# Patient Record
Sex: Male | Born: 1945 | Race: White | Hispanic: No | Marital: Married | Smoking: Never smoker
Health system: Southern US, Community
[De-identification: ages and names within clinical notes are randomized; demographics above are authoritative.]

## PROBLEM LIST (undated history)

## (undated) DIAGNOSIS — T7840XA Allergy, unspecified, initial encounter: Secondary | ICD-10-CM

## (undated) DIAGNOSIS — K219 Gastro-esophageal reflux disease without esophagitis: Secondary | ICD-10-CM

## (undated) DIAGNOSIS — J339 Nasal polyp, unspecified: Secondary | ICD-10-CM

## (undated) DIAGNOSIS — E785 Hyperlipidemia, unspecified: Secondary | ICD-10-CM

## (undated) DIAGNOSIS — J309 Allergic rhinitis, unspecified: Secondary | ICD-10-CM

## (undated) DIAGNOSIS — E7800 Pure hypercholesterolemia, unspecified: Secondary | ICD-10-CM

## (undated) HISTORY — DX: Allergy, unspecified, initial encounter: T78.40XA

## (undated) HISTORY — PX: NASAL POLYP SURGERY: SHX186

## (undated) HISTORY — DX: Nasal polyp, unspecified: J33.9

## (undated) HISTORY — DX: Hyperlipidemia, unspecified: E78.5

## (undated) HISTORY — DX: Gastro-esophageal reflux disease without esophagitis: K21.9

---
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
## 2003-11-15 HISTORY — PX: NASAL SINUS SURGERY: SHX719

## 2004-02-10 ENCOUNTER — Encounter: Admission: RE | Admit: 2004-02-10 | Discharge: 2004-02-10 | Payer: Self-pay | Admitting: Otolaryngology

## 2004-03-29 ENCOUNTER — Ambulatory Visit (HOSPITAL_COMMUNITY): Admission: RE | Admit: 2004-03-29 | Discharge: 2004-03-29 | Payer: Self-pay | Admitting: Otolaryngology

## 2004-03-29 ENCOUNTER — Encounter (INDEPENDENT_AMBULATORY_CARE_PROVIDER_SITE_OTHER): Payer: Self-pay | Admitting: *Deleted

## 2004-03-29 ENCOUNTER — Ambulatory Visit (HOSPITAL_BASED_OUTPATIENT_CLINIC_OR_DEPARTMENT_OTHER): Admission: RE | Admit: 2004-03-29 | Discharge: 2004-03-29 | Payer: Self-pay | Admitting: Otolaryngology

## 2004-09-22 ENCOUNTER — Ambulatory Visit: Payer: Self-pay | Admitting: Sports Medicine

## 2004-09-22 ENCOUNTER — Ambulatory Visit (HOSPITAL_COMMUNITY): Admission: RE | Admit: 2004-09-22 | Discharge: 2004-09-22 | Payer: Self-pay | Admitting: Sports Medicine

## 2004-09-23 ENCOUNTER — Ambulatory Visit: Payer: Self-pay | Admitting: Sports Medicine

## 2004-10-20 ENCOUNTER — Ambulatory Visit: Payer: Self-pay | Admitting: Sports Medicine

## 2004-11-24 ENCOUNTER — Ambulatory Visit (HOSPITAL_COMMUNITY): Admission: RE | Admit: 2004-11-24 | Discharge: 2004-11-24 | Payer: Self-pay | Admitting: Sports Medicine

## 2004-12-08 ENCOUNTER — Ambulatory Visit: Payer: Self-pay | Admitting: Sports Medicine

## 2005-03-25 ENCOUNTER — Ambulatory Visit: Payer: Self-pay | Admitting: Family Medicine

## 2005-05-18 ENCOUNTER — Ambulatory Visit: Payer: Self-pay | Admitting: Family Medicine

## 2007-01-11 DIAGNOSIS — J309 Allergic rhinitis, unspecified: Secondary | ICD-10-CM

## 2007-01-11 DIAGNOSIS — E785 Hyperlipidemia, unspecified: Secondary | ICD-10-CM

## 2007-01-11 HISTORY — DX: Hyperlipidemia, unspecified: E78.5

## 2007-01-11 HISTORY — DX: Allergic rhinitis, unspecified: J30.9

## 2009-01-31 ENCOUNTER — Emergency Department (HOSPITAL_COMMUNITY): Admission: EM | Admit: 2009-01-31 | Discharge: 2009-02-01 | Payer: Self-pay | Admitting: Emergency Medicine

## 2011-04-01 NOTE — Op Note (Signed)
NAME:  Carl Henry, Carl Henry                        ACCOUNT NO.:  [id number]   MEDICAL RECORD NO.:  [id number]                   PATIENT TYPE:  AMB   LOCATION:  DSC                                  FACILITY:  [facility]   PHYSICIAN:  Kinnie Scales. Annalee Genta, M.D.            DATE OF BIRTH:  29-Mar-1946   DATE OF PROCEDURE:  03/29/2004  DATE OF DISCHARGE:                                 OPERATIVE REPORT   PREOPERATIVE DIAGNOSES:  1. Chronic nasal polyposis.  2. Chronic sinusitis.  3. Status post prior sinus surgery and septoplasty.   POSTOPERATIVE DIAGNOSES:  1. Chronic nasal polyposis.  2. Chronic sinusitis.  3. Status post prior sinus surgery and septoplasty.   INDICATION FOR PROCEDURE:  1. Chronic nasal polyposis.  2. Chronic sinusitis.  3. Status post prior sinus surgery and septoplasty.   SURGICAL PROCEDURES:  1. Bilateral revision endoscopic sinus surgery with InstaTrak guidance,     consisting of total ethmoidectomy, nasofrontal recess exploration, and     sphenoidotomy with removal of diseased sphenoid tissue.  2. Bilateral inferior turbinate intramural cautery.   ANESTHESIA:  General endotracheal.   SURGEON:  Kinnie Scales. Annalee Genta, M.D.   COMPLICATIONS:  None.   ESTIMATED BLOOD LOSS:  Approximately 600 mL.   The patient was transferred from the operating room to the recovery room in  stable condition.   BRIEF HISTORY:  Carl Henry is a 65 year old white male who was referred for  evaluation of chronic nasal obstruction and chronic sinusitis.  The patient  had a history of nasal polyposis and had undergone multiple prior sinus  procedures, including nasal septoplasty and several sinus procedures for  nasal polyposis, last procedure performed approximately 10 years ago.  The  patient had noted a gradual worsening in symptoms of nasal congestion,  airway obstruction, and recurrent sinusitis.  He was seen in the office and  treated with a course of broad-spectrum antibiotic therapy,  oral steroids,  and nasal hygiene.  At the conclusion of his therapy a CT scan was  performed, which showed near-complete opacification of the nasal cavity and  sinuses.  Given that history he was retreated with a prolonged course of  antibiotics and oral steroids.  A follow-up CT scan was performed, and this  showed some improvement in his nasal polyposis but chronic obstruction of  the ethmoid, frontal sinus, and sphenoid sinuses bilaterally.  The patient  had undergone prior sinonasal surgery, had a large posterior nasal septal  perforation, as well as widely patent bilateral maxillary antrostomies.  Given the patient's clinical history and findings, including a failure to  respond to appropriate medical therapy, an InstaTrak CT scan was performed  and the patient was scheduled for bilateral revision endoscopic sinus  surgery and nasal polypectomy and turbinate reduction.  The risks, benefits,  and possible complications of these surgical procedures was discussed in  detail with the patient and his wife, who understood and concurred with  our  plan for surgery, which was scheduled as above.   SURGICAL PROCEDURE:  The patient was brought to the operating room at [facility] day surgical center.  Under general anesthesia the patient's  nasal cavity was injected with 6 mL of 1% lidocaine with 1:100,000 solution  of epinephrine injected in a submucosal fashion along the lateral nasal  wall, within the nasal polyps, and in the inferior turbinates.  The patient  was also injected via sphenopalatine fossa bilaterally.  The patient's nose  was then packed with Afrin-soaked cottonoid pledgets, which were left in  place for approximately 10 minutes to allow for vasoconstriction.  The  patient was prepped and draped in a sterile fashion and the InstaTrak  headgear was applied.  Anatomic and surgical landmarks were identified and  confirmed.  The surgical procedure was then begun on the  patient's left-hand  side.  Using a 0 degree telescope nasal endoscopy was performed.  The  patient was found to have massive nasal polyposis with near-complete  obstruction of the nasal cavity.  The polyps extended from the ethmoid  sinuses, filling the anterior and posterior aspects of the left nasal  cavity.  Using a straight microdebrider, polypoid disease was resected from  the patient's nasal cavity.  Dissection was carried to the level of the  common ethmoid region, where previous endoscopic sinus surgery had been  undertaken.  The patient had a large left maxillary sinus ostium, which was  patent without evidence of active disease or infection.  Total ethmoidectomy  was then performed using a 0 degree telescope.  Dissection was carried along  the floor of the ethmoid sinus, removing bony septations and polypoid  disease.  The posterior aspect of the ethmoid sinus was identified and a 30  degree nasal endoscope was used along the roof of the ethmoid sinus,  dissecting from posterior to anterior.  The InstaTrak computer-assisted  navigation device was used throughout the case for surgical localization.  The nasofrontal recess was identified.  It was completely occluded with  polyp disease, and curved microdebrider and through-cutting forceps were  used to resect polypoid material at the opening of the frontal sinus.  The  dissection was then extended anteriorly and superiorly to create a widely  patent nasofrontal recess.  The natural ostium of the sphenoid sinus was the  identified.  This was completely overgrown with polypoid material, and  polyps were resected along the inferior and lateral aspect of the natural  ostium of the sphenoid sinus as well as some intrasphenoid sinus disease was  gently resected.  Attention was then turned to the patient's right-hand  side.   Right endoscopic sinus surgery was then undertaken.  Again starting with the 0 degree telescope, heavy nasal  polyposis was resected from within the nasal  cavity, allowing access to the common ethmoid region and maxillary  antrostomy.  The previously-created maxillary antrostomy was widely patent,  and there was no evidence of active infection within the maxillary sinus.  Using a 0 degree telescope, ethmoidectomy was undertaken along the orbit  from anterior to posterior.  Posterior ethmoid air cells were identified and  the roof of the ethmoid sinus was identified with the InstaTrak.  Dissection  was then carried from posterior to anterior using a 30 degree telescope and  curved microdebrider tip for resection of polypoid material along the roof  of the ethmoid sinus.  The nasofrontal recess was identified, and again this  was completely occluded with  polyp disease.  Polyps were resected and  dissection was carried along the anterior aspect of the nasofrontal recess  to allow access to the frontal sinus.  The frontal sinus was patent without  evidence of active sinusitis.  The posterior component of the ethmoid sinus  was then inspected.  The natural ostium of the sphenoid sinus was identified  with the InstaTrak device and the ostium was enlarged in an inferior and  medial direction.  Polypoid disease from the lateral component of the  sphenoid ostium was also resected, creating a patent sphenoidotomy.  With  sinus disease removed, the patient's nasal cavity was irrigated and  inspected.  There were several areas of hemorrhage, which were cauterized  with suction cautery.  A 50/50 mix of Bactroban and Kenalog 40 mg were then  instilled within the frontal, ethmoid, and maxillary sinus bilaterally, and  bilateral Kennedy sinus packs were placed in the common ethmoid cavity after  the application of Bactroban ointment.  They were hydrated with saline  solution.   Attention was then turned to the inferior turbinates.  There was significant  polypoid material along the right inferior turbinate,  which was debrided.  Bilateral inferior turbinate intramural cautery was then performed and the  turbinates were outfractured, creating a more patent nasal cavity.  The  patient's nasal cavity and nasopharynx were then irrigated and suctioned, an  orogastric tube was passed, and the stomach contents were aspirated.  The  patient was awakened from his anesthetic.  He was extubated and transferred  from the operating room to the recovery room in stable condition.  There  were no complications.  Blood loss was approximately 600 mL.                                               Kinnie Scales. Annalee Genta, M.D.    DLS/MEDQ  D:  16/08/9603  T:  03/29/2004  Job:  [id number]

## 2012-02-15 LAB — BASIC METABOLIC PANEL
BUN: 12 mg/dL (ref 4–21)
Creatinine: 0.8 mg/dL (ref <=1.3)
Glucose: 94 mg/dL
Potassium: 4.2 mmol/L (ref 3.4–5.3)
Sodium: 142 mmol/L (ref 137–147)

## 2012-02-15 LAB — CBC AND DIFFERENTIAL: WBC: 5.6 10^3/mL

## 2013-04-01 DIAGNOSIS — Z0271 Encounter for disability determination: Secondary | ICD-10-CM

## 2013-04-09 HISTORY — PX: COLONOSCOPY: SHX174

## 2013-04-09 LAB — HM COLONOSCOPY: HM Colonoscopy: NORMAL

## 2013-08-19 ENCOUNTER — Ambulatory Visit (INDEPENDENT_AMBULATORY_CARE_PROVIDER_SITE_OTHER): Payer: Medicare Other | Admitting: Family Medicine

## 2013-08-19 VITALS — BP 118/62 | HR 60 | Temp 98.0°F | Resp 16 | Ht 70.5 in | Wt 213.0 lb

## 2013-08-19 DIAGNOSIS — J209 Acute bronchitis, unspecified: Secondary | ICD-10-CM

## 2013-08-19 DIAGNOSIS — R059 Cough, unspecified: Secondary | ICD-10-CM

## 2013-08-19 DIAGNOSIS — R05 Cough: Secondary | ICD-10-CM

## 2013-08-19 MED ORDER — AZITHROMYCIN 250 MG PO TABS: ORAL_TABLET | ORAL | Status: DC

## 2013-08-19 MED ORDER — BENZONATATE 100 MG PO CAPS: 100.0000 mg | ORAL_CAPSULE | Freq: Three times a day (TID) | ORAL | Status: DC | PRN

## 2013-08-19 NOTE — Patient Instructions (Addendum)
Use the antibiotic as directed and the tessalon as needed for cough.  Let me know if you are not feeling better soon!

## 2013-08-19 NOTE — Progress Notes (Signed)
[facility] [address] [place] [address] [phone]  Date:  08/19/2013   Name:  Carl Henry   DOB:  1946-02-06   MRN:  [id number]  PCP:  Nilda Simmer, MD    Chief Complaint: Cough   History of Present Illness:  Carl Henry is a 67 y.o. very pleasant male patient who presents with the following:  Here today as a new pt. Generally healthy.   States he has had a "cold" for 4 weeks. It started with a ST, then sinus and head congestion. Now he has "a real deep cough."   The cough is non- productive.   He does not tend to get sick. He did have chills, but these have resolved.  No aches or fever.   No GI symptoms.   He has tried generic mucinex OTC.   He tends to cough more when he lies down at night, and also in the morning He has not noted any wheezing.    He did have runny nose and sneezing, but this went away.  He does blow his nose a bit sometimes.    He is generally healthy.    Patient Active Problem List   Diagnosis Date Noted  . HYPERLIPIDEMIA 01/11/2007  . RHINITIS, ALLERGIC 01/11/2007    No past medical history on file.  No past surgical history on file.  History  Substance Use Topics  . Smoking status: Never Smoker   . Smokeless tobacco: Not on file  . Alcohol Use: Not on file    No family history on file.  Allergies  Allergen Reactions  . Latex     Medication list has been reviewed and updated.  No current outpatient prescriptions on file prior to visit.   No current facility-administered medications on file prior to visit.    Review of Systems:  As per HPI- otherwise negative.   Physical Examination: Filed Vitals:   08/19/13 0943  BP: 118/62  Pulse: 60  Temp: 98 F (36.7 C)  Resp: 16   Filed Vitals:   08/19/13 0943  Height: 5' 10.5" (1.791 m)  Weight: 213 lb (96.616 kg)   Body mass index is 30.12 kg/(m^2). Ideal Body Weight: Weight in (lb) to have BMI = 25: 176.4  GEN: WDWN, NAD,  Non-toxic, A & O x 3, looks well HEENT: Atraumatic, Normocephalic. Neck supple. No masses, No LAD.  Bilateral TM wnl, oropharynx normal.  PEERL,EOMI.   Ears and Nose: No external deformity. CV: RRR, No M/G/R. No JVD. No thrill. No extra heart sounds. PULM: CTA B, no wheezes, crackles, rhonchi. No retractions. No resp. distress. No accessory muscle use. EXTR: No c/c/e NEURO Normal gait.  PSYCH: Normally interactive. Conversant. Not depressed or anxious appearing.  Calm demeanor.    Assessment and Plan: Acute bronchitis - Plan: azithromycin (ZITHROMAX) 250 MG tablet, benzonatate (TESSALON) 100 MG capsule  Cough  Cough for one month.  zpack and tessalon- follow-up if not better.  Defer flu shot today due to acute illness- he will plan to get this in about a week.    Signed Abbe Amsterdam, MD

## 2013-08-29 ENCOUNTER — Ambulatory Visit (INDEPENDENT_AMBULATORY_CARE_PROVIDER_SITE_OTHER): Payer: BC Managed Care – PPO | Admitting: Family Medicine

## 2013-08-29 VITALS — BP 130/70 | HR 61 | Temp 97.9°F | Resp 16 | Ht 70.5 in | Wt 213.0 lb

## 2013-08-29 DIAGNOSIS — Z23 Encounter for immunization: Secondary | ICD-10-CM

## 2013-08-29 DIAGNOSIS — J209 Acute bronchitis, unspecified: Secondary | ICD-10-CM

## 2013-08-29 DIAGNOSIS — J309 Allergic rhinitis, unspecified: Secondary | ICD-10-CM

## 2013-08-29 MED ORDER — MOMETASONE FUROATE 50 MCG/ACT NA SUSP: 2.0000 | Freq: Every day | NASAL | Status: DC

## 2013-08-29 NOTE — Progress Notes (Signed)
Subjective:  This chart was scribed for Carl Chick, MD by Quintella Reichert, ED scribe.  This patient was seen in room [facility] Room 10 and the patient's care was started at 5:06 PM.   [name], male    DOB: 1946-06-05, 67 y.o.   MRN: [id number]  Chief Complaint  Patient presents with  . Follow-up    bronchitis  . Medication Refill    Wants to be put back on Nasonex    HPI   HPI Comments: Carl Henry is a 67 y.o. male who presents for a recheck for bronchitis.  Pt was seen by Dr. Patsy Lager 10 days ago for a persistent cough and diagnosed with bronchitis.  He was given a Z-pack which he took as instructed and he states that his symptoms have since mostly resolved.  He states "I coughed yesterday I think once, and today I don't remember coughing."  Congestion has cleared up mostly but his wife states that he still sounds slightly nasally.  He denies SOB or sinus tenderness.  He is taking Mucinex which is providing significant relief.  He is also requesting to be put back on Nasonex which has been effective for him on prior occasions.    Pt's symptoms initially presented as a sore throat 6 weeks ago.  He then developed rhinorrhea and sinus and head congestion, followed by a cough intermittently productive of mucus from drainage.  He denies producing mucus from his chest.  He states "it began in head, then went down to my throat, and then to my chest."   He sometimes also experienced chills but denies fever.   Pt is generally healthy and notes that before he developed bronchitis he had not had a cold in at least 7 years.  He states that he does have a h/o 2 surgeries to treat severe sinusitis.  He has never been diagnosed with seasonal allergies.    Pt also requests his flu shot today.  He has not had his pneumonia shot.  Pt's last physical was in April 2013 and he states he hopes to wait until April of this year for his next physical because he is planning to travel  with his father to see relatives.  He states his father's health is improved although he expresses some frustration that his father has not been compliant with his medical treatment.   History reviewed. No pertinent past medical history.     Medication List       This list is accurate as of: 08/29/13  5:06 PM.  Always use your most recent med list.               aspirin 81 MG tablet  Take 81 mg by mouth daily.     benzonatate 100 MG capsule  Commonly known as:  TESSALON  Take 1 capsule (100 mg total) by mouth 3 (three) times daily as needed for cough.     dextromethorphan-guaiFENesin 30-600 MG per 12 hr tablet  Commonly known as:  MUCINEX DM  Take 1 tablet by mouth every 12 (twelve) hours.     montelukast 10 MG tablet  Commonly known as:  SINGULAIR  Take 10 mg by mouth at bedtime.     rosuvastatin 10 MG tablet  Commonly known as:  CRESTOR  Take 10 mg by mouth daily.        History reviewed. No pertinent past surgical history.   Allergies  Allergen Reactions  . Latex  Review of Systems  Constitutional: Negative for fever.  HENT: Positive for congestion. Negative for sinus pressure.   Respiratory: Positive for cough (mild, resolving). Negative for shortness of breath.       BP 130/70  Pulse 61  Temp(Src) 97.9 F (36.6 C) (Oral)  Resp 16  Ht 5' 10.5" (1.791 m)  Wt 213 lb (96.616 kg)  BMI 30.12 kg/m2  SpO2 96%  Objective:   Physical Exam  Nursing note and vitals reviewed. Constitutional: [name]. No distress.  HENT:  Nose: Mucosal edema present.  Mouth/Throat: Oropharynx is clear and moist. No oropharyngeal exudate.  Nose with congestion and edema  Eyes: Conjunctivae and EOM are normal. Pupils are equal, round, and reactive to light.  Neck: Neck supple. No thyromegaly present.  Cardiovascular: Normal rate, regular rhythm and normal heart sounds.   No murmur heard. Pulmonary/Chest: Effort normal and breath  sounds normal. No respiratory distress. He has no wheezes. He has no rales.  Lymphadenopathy:    He has no cervical adenopathy.  Neurological: He is alert.  Skin: Skin is warm and dry.  Psychiatric: He has a normal mood and affect. His behavior is normal.   PNEUMOVAX ADMINISTERED IN OFFICE.  INFLUENZA VACCINE ADMINISTERED IN OFFICE.    Assessment & Plan:   1. Allergic rhinitis, cause unspecified   2. Acute bronchitis   3. Need for prophylactic vaccination and inoculation against influenza   4. Need for pneumococcal vaccination    1. Allergic Rhinitis:  Uncontrolled; rx for Nasonex provided. 2.  Acute bronchitis: improved with Zpack.  No further treatment indicated. 3.  S/p Pneumovax. 4. s /p influenza vaccine.  Meds ordered this encounter  Medications  . mometasone (NASONEX) 50 MCG/ACT nasal spray    Sig: Place 2 sprays into the nose daily.    Dispense:  17 g    Refill:  12   I personally performed the services described in this documentation, which was scribed in my presence.  The recorded information has been reviewed and is accurate.  Nilda Simmer, M.D.  [facility] [address] [place], [place]  [address] [phone] phone [phone] fax

## 2013-09-02 ENCOUNTER — Telehealth: Payer: Self-pay

## 2013-09-02 MED ORDER — FLUTICASONE PROPIONATE 50 MCG/ACT NA SUSP: 2.0000 | Freq: Every day | NASAL | Status: DC

## 2013-09-02 NOTE — Telephone Encounter (Signed)
Pharm sent request to change Rx for nasonex to a cheaper alternative. Price for nasonex is #105.00 co-pay.

## 2013-09-02 NOTE — Telephone Encounter (Signed)
Flonase sent to pharmacy

## 2013-10-11 ENCOUNTER — Ambulatory Visit (INDEPENDENT_AMBULATORY_CARE_PROVIDER_SITE_OTHER): Payer: BC Managed Care – PPO | Admitting: Emergency Medicine

## 2013-10-11 VITALS — BP 134/68 | HR 64 | Temp 97.7°F | Resp 14 | Ht 71.0 in | Wt 208.0 lb

## 2013-10-11 DIAGNOSIS — M5430 Sciatica, unspecified side: Secondary | ICD-10-CM

## 2013-10-11 DIAGNOSIS — M5431 Sciatica, right side: Secondary | ICD-10-CM

## 2013-10-11 MED ORDER — HYDROCODONE-ACETAMINOPHEN 5-325 MG PO TABS: 1.0000 | ORAL_TABLET | ORAL | Status: DC | PRN

## 2013-10-11 MED ORDER — CYCLOBENZAPRINE HCL 10 MG PO TABS: 10.0000 mg | ORAL_TABLET | Freq: Three times a day (TID) | ORAL | Status: DC | PRN

## 2013-10-11 MED ORDER — NAPROXEN SODIUM 550 MG PO TABS: 550.0000 mg | ORAL_TABLET | Freq: Two times a day (BID) | ORAL | Status: DC

## 2013-10-11 NOTE — Patient Instructions (Signed)
Sciatica °Sciatica is pain, weakness, numbness, or tingling along the path of the sciatic nerve. The nerve starts in the lower back and runs down the back of each leg. The nerve controls the muscles in the lower leg and in the back of the knee, while also providing sensation to the back of the thigh, lower leg, and the sole of your foot. Sciatica is a symptom of another medical condition. For instance, nerve damage or certain conditions, such as a herniated disk or bone spur on the spine, pinch or put pressure on the sciatic nerve. This causes the pain, weakness, or other sensations normally associated with sciatica. Generally, sciatica only affects one side of the body. °CAUSES  °· Herniated or slipped disc. °· Degenerative disk disease. °· A pain disorder involving the narrow muscle in the buttocks (piriformis syndrome). °· Pelvic injury or fracture. °· Pregnancy. °· Tumor (rare). °SYMPTOMS  °Symptoms can vary from mild to very severe. The symptoms usually travel from the low back to the buttocks and down the back of the leg. Symptoms can include: °· Mild tingling or dull aches in the lower back, leg, or hip. °· Numbness in the back of the calf or sole of the foot. °· Burning sensations in the lower back, leg, or hip. °· Sharp pains in the lower back, leg, or hip. °· Leg weakness. °· Severe back pain inhibiting movement. °These symptoms may get worse with coughing, sneezing, laughing, or prolonged sitting or standing. Also, being overweight may worsen symptoms. °DIAGNOSIS  °Your caregiver will perform a physical exam to look for common symptoms of sciatica. He or she may ask you to do certain movements or activities that would trigger sciatic nerve pain. Other tests may be performed to find the cause of the sciatica. These may include: °· Blood tests. °· X-rays. °· Imaging tests, such as an MRI or CT scan. °TREATMENT  °Treatment is directed at the cause of the sciatic pain. Sometimes, treatment is not necessary  and the pain and discomfort goes away on its own. If treatment is needed, your caregiver may suggest: °· Over-the-counter medicines to relieve pain. °· Prescription medicines, such as anti-inflammatory medicine, muscle relaxants, or narcotics. °· Applying heat or ice to the painful area. °· Steroid injections to lessen pain, irritation, and inflammation around the nerve. °· Reducing activity during periods of pain. °· Exercising and stretching to strengthen your abdomen and improve flexibility of your spine. Your caregiver may suggest losing weight if the extra weight makes the back pain worse. °· Physical therapy. °· Surgery to eliminate what is pressing or pinching the nerve, such as a bone spur or part of a herniated disk. °HOME CARE INSTRUCTIONS  °· Only take over-the-counter or prescription medicines for pain or discomfort as directed by your caregiver. °· Apply ice to the affected area for 20 minutes, 3 4 times a day for the first 48 72 hours. Then try heat in the same way. °· Exercise, stretch, or perform your usual activities if these do not aggravate your pain. °· Attend physical therapy sessions as directed by your caregiver. °· Keep all follow-up appointments as directed by your caregiver. °· Do not wear high heels or shoes that do not provide proper support. °· Check your mattress to see if it is too soft. A firm mattress may lessen your pain and discomfort. °SEEK IMMEDIATE MEDICAL CARE IF:  °· You lose control of your bowel or bladder (incontinence). °· You have increasing weakness in the lower back,   pelvis, buttocks, or legs. °· You have redness or swelling of your back. °· You have a burning sensation when you urinate. °· You have pain that gets worse when you lie down or awakens you at night. °· Your pain is worse than you have experienced in the past. °· Your pain is lasting longer than 4 weeks. °· You are suddenly losing weight without reason. °MAKE SURE YOU: °· Understand these  instructions. °· Will watch your condition. °· Will get help right away if you are not doing well or get worse. °Document Released: 10/25/2001 Document Revised: 05/01/2012 Document Reviewed: 03/11/2012 °Exit[employer]® Patient Information ©2014 [employer]. ° °

## 2013-10-11 NOTE — Progress Notes (Signed)
[facility] [address], [place] [address] [phone]  Date:  10/11/2013   Name:  Carl Henry   DOB:  [birth year]   MRN:  [id number]  PCP:  Nilda Simmer, MD    Chief Complaint: Back Pain   History of Present Illness:  Carl Henry is a 67 y.o. very pleasant male patient who presents with the following:  1 month history of low back pain mostly on right.  Radiated into right leg and into foot.  Now has pain mostly in right foot radiating into lower leg.  No history of injury or overuse.  No weakness, numbness or tingling in leg. Worse with sitting.  No history of prior injury.  No improvement with over the counter medications or other home remedies. Denies other complaint or health concern today.   Patient Active Problem List   Diagnosis Date Noted  . HYPERLIPIDEMIA 01/11/2007  . RHINITIS, ALLERGIC 01/11/2007    No past medical history on file.  No past surgical history on file.  History  Substance Use Topics  . Smoking status: Never Smoker   . Smokeless tobacco: Not on file  . Alcohol Use: Not on file    No family history on file.  Allergies  Allergen Reactions  . Latex     Medication list has been reviewed and updated.  Current Outpatient Prescriptions on File Prior to Visit  Medication Sig Dispense Refill  . aspirin 81 MG tablet Take 81 mg by mouth daily.      . fluticasone (FLONASE) 50 MCG/ACT nasal spray Place 2 sprays into the nose daily.  16 g  12  . montelukast (SINGULAIR) 10 MG tablet Take 10 mg by mouth at bedtime.      . rosuvastatin (CRESTOR) 10 MG tablet Take 10 mg by mouth daily.       No current facility-administered medications on file prior to visit.    Review of Systems:  As per HPI, otherwise negative.    Physical Examination: Filed Vitals:   10/11/13 0857  BP: 134/68  Pulse: 64  Temp: 97.7 F (36.5 C)  Resp: 14   Filed Vitals:   10/11/13 0857  Height: 5\' 11"  (1.803 m)  Weight: 208 lb (94.348  kg)   Body mass index is 29.02 kg/(m^2). Ideal Body Weight: Weight in (lb) to have BMI = 25: 178.9   GEN: WDWN, NAD, Non-toxic, Alert & Oriented x 3 HEENT: Atraumatic, Normocephalic.  Ears and Nose: No external deformity. EXTR: No clubbing/cyanosis/edema NEURO: Normal gait. Gross motor intact.   PSYCH: Normally interactive. Conversant. Not depressed or anxious appearing.  Calm demeanor.  Back:  Tender right sciatic notch with normal neuro exam.     Assessment and Plan: Sciatic neuritis Anaprox ds Flexeril vicodin Follow up in 2 weeks if not improved or sooner if worse.  Plan MRI  Signed,  Phillips Odor, MD

## 2013-10-15 ENCOUNTER — Telehealth: Payer: Self-pay

## 2013-10-15 NOTE — Telephone Encounter (Signed)
Patient will come back in to see dr hopper in 2 weeks.

## 2013-10-16 ENCOUNTER — Ambulatory Visit (INDEPENDENT_AMBULATORY_CARE_PROVIDER_SITE_OTHER): Payer: Medicare Other | Admitting: Family Medicine

## 2013-10-16 VITALS — BP 148/80 | HR 63 | Temp 97.9°F | Resp 16 | Ht 72.0 in | Wt 212.6 lb

## 2013-10-16 DIAGNOSIS — M5416 Radiculopathy, lumbar region: Secondary | ICD-10-CM

## 2013-10-16 DIAGNOSIS — IMO0002 Reserved for concepts with insufficient information to code with codable children: Secondary | ICD-10-CM

## 2013-10-16 DIAGNOSIS — R200 Anesthesia of skin: Secondary | ICD-10-CM

## 2013-10-16 DIAGNOSIS — R209 Unspecified disturbances of skin sensation: Secondary | ICD-10-CM

## 2013-10-16 MED ORDER — PREDNISONE 20 MG PO TABS: ORAL_TABLET | ORAL | Status: DC

## 2013-10-16 MED ORDER — OXYCODONE-ACETAMINOPHEN 7.5-325 MG PO TABS: 1.0000 | ORAL_TABLET | ORAL | Status: DC | PRN

## 2013-10-16 NOTE — Patient Instructions (Addendum)
Take the prednisone 3 pills daily for 3 days, then 2 pills daily for 3 days, then one pill daily for 3 days, then one half pill daily for 4 days  Continue the Flexeril and naproxen  Take the oxycodone for severe pain. If the pain gets milder you can revert to the hydrocodone.  Return at any time if worse, otherwise return when finished the prednisone  The Dr. she was seen for this are Dr. Marice Potter and Dr. Janace Hoard and Dr. Katrinka Blazing is your regular doctor

## 2013-10-16 NOTE — Progress Notes (Signed)
Subjective: Patient was in here less than a week ago with a right lumbar sciatica. The pain has gotten worse rather than better. He would've given the bad pains about 7 then, he gives them about a 9/10 now. He has a burning hurting him to his right foot. the foot has developed numbness. The burning pain is down the lateral aspect of the leg, mostly the lower leg. He hurts some and the right low back/hip area but that is not the bad pain. He has continued try and move around some. Not doing anything or extraneous.  Objective: Alert oriented man in some discomfort when he tries to move around. He is not terribly tender in the low back. If straight leg raising test on the right is nonspecific. The knee jerks are fairly symmetrical the right ankle jerk is greatly diminished.  Assessment: Right lumbar sciatica Foot pain and numbness  Plan: Tapered steroid Increased pain medication to Percocet 7.5/325 If this does not improve he will need to have an MRI in the next couple of weeks. Talked to her about the pros and cons of early scanning versus waiting a while. We will attempt to wait and see if conservative treatment does the job first.

## 2013-11-04 ENCOUNTER — Other Ambulatory Visit: Payer: Self-pay | Admitting: Emergency Medicine

## 2013-11-08 ENCOUNTER — Telehealth: Payer: Self-pay

## 2013-11-08 MED ORDER — NAPROXEN SODIUM 550 MG PO TABS: 550.0000 mg | ORAL_TABLET | Freq: Two times a day (BID) | ORAL | Status: DC

## 2013-11-08 NOTE — Telephone Encounter (Signed)
Please advise on naproxen request, pended, then we can send to Dr Alwyn Ren to advise on the Oxycodone.

## 2013-11-08 NOTE — Telephone Encounter (Signed)
Pt would like a refill on oxyCODONE-acetaminophen (PERCOCET) 7.5-325 MG per tablet and naproxen sodium (ANAPROX DS) 550 MG tablet. Best# [phone]

## 2013-11-08 NOTE — Telephone Encounter (Signed)
Naproxen authorized. The note 12/03 says to return when finished the prednisone (sooner if he worsened). Please advise the patient to RTC if he needs pain medication refilled.  Meds ordered this encounter  Medications  . naproxen sodium (ANAPROX DS) 550 MG tablet    Sig: Take 1 tablet (550 mg total) by mouth 2 (two) times daily with a meal.    Dispense:  40 tablet    Refill:  0

## 2013-11-09 NOTE — Telephone Encounter (Signed)
Pt is RTC today to see Dr. Alwyn Ren for medication refill.

## 2013-11-19 ENCOUNTER — Ambulatory Visit: Payer: BC Managed Care – PPO | Admitting: Family Medicine

## 2013-11-27 ENCOUNTER — Ambulatory Visit: Payer: BC Managed Care – PPO | Admitting: Family Medicine

## 2013-12-12 ENCOUNTER — Encounter: Payer: Self-pay | Admitting: Family Medicine

## 2013-12-17 ENCOUNTER — Ambulatory Visit: Payer: BC Managed Care – PPO | Admitting: Family Medicine

## 2014-02-17 ENCOUNTER — Encounter: Payer: Self-pay | Admitting: Family Medicine

## 2014-02-17 ENCOUNTER — Ambulatory Visit (INDEPENDENT_AMBULATORY_CARE_PROVIDER_SITE_OTHER): Payer: Medicare Other | Admitting: Family Medicine

## 2014-02-17 ENCOUNTER — Ambulatory Visit: Payer: Medicare Other

## 2014-02-17 VITALS — BP 136/78 | HR 58 | Temp 98.1°F | Resp 16 | Ht 70.0 in | Wt 206.0 lb

## 2014-02-17 DIAGNOSIS — Z1211 Encounter for screening for malignant neoplasm of colon: Secondary | ICD-10-CM

## 2014-02-17 DIAGNOSIS — Z0000 Encounter for general adult medical examination without abnormal findings: Secondary | ICD-10-CM

## 2014-02-17 DIAGNOSIS — M545 Low back pain, unspecified: Secondary | ICD-10-CM

## 2014-02-17 DIAGNOSIS — Z125 Encounter for screening for malignant neoplasm of prostate: Secondary | ICD-10-CM

## 2014-02-17 DIAGNOSIS — E7800 Pure hypercholesterolemia, unspecified: Secondary | ICD-10-CM

## 2014-02-17 DIAGNOSIS — Z131 Encounter for screening for diabetes mellitus: Secondary | ICD-10-CM

## 2014-02-17 DIAGNOSIS — J309 Allergic rhinitis, unspecified: Secondary | ICD-10-CM

## 2014-02-17 HISTORY — DX: Allergic rhinitis, unspecified: J30.9

## 2014-02-17 HISTORY — DX: Pure hypercholesterolemia, unspecified: E78.00

## 2014-02-17 LAB — CBC WITH DIFFERENTIAL/PLATELET
Basophils Absolute: 0.1 10*3/uL (ref 0.0–0.1)
Basophils Relative: 1 % (ref 0–1)
Eosinophils Absolute: 0.2 10*3/uL (ref 0.0–0.7)
Eosinophils Relative: 3 % (ref 0–5)
HCT: 45.4 % (ref 39.0–52.0)
Hemoglobin: 15.6 g/dL (ref 13.0–17.0)
Lymphocytes Relative: 27 % (ref 12–46)
Lymphs Abs: 1.7 10*3/uL (ref 0.7–4.0)
MCH: 28.9 pg (ref 26.0–34.0)
MCHC: 34.4 g/dL (ref 30.0–36.0)
MCV: 84.2 fL (ref 78.0–100.0)
Monocytes Absolute: 0.6 10*3/uL (ref 0.1–1.0)
Monocytes Relative: 9 % (ref 3–12)
Neutro Abs: 3.8 10*3/uL (ref 1.7–7.7)
Neutrophils Relative %: 60 % (ref 43–77)
Platelets: 251 10*3/uL (ref 150–400)
RBC: 5.39 MIL/uL (ref 4.22–5.81)
RDW: 13.8 % (ref 11.5–15.5)
WBC: 6.4 10*3/uL (ref 4.0–10.5)

## 2014-02-17 LAB — COMPLETE METABOLIC PANEL WITH GFR
ALT: 16 U/L (ref 0–53)
AST: 19 U/L (ref 0–37)
Albumin: 4.3 g/dL (ref 3.5–5.2)
Alkaline Phosphatase: 68 U/L (ref 39–117)
BUN: 14 mg/dL (ref 6–23)
CO2: 27 mEq/L (ref 19–32)
Calcium: 9.2 mg/dL (ref 8.4–10.5)
Chloride: 106 mEq/L (ref 96–112)
Creat: 0.89 mg/dL (ref 0.50–1.35)
GFR, Est African American: >89 mL/min
GFR, Est Non African American: 89 mL/min
Glucose, Bld: 94 mg/dL (ref 70–99)
Potassium: 4.3 mEq/L (ref 3.5–5.3)
Sodium: 140 mEq/L (ref 135–145)
Total Bilirubin: 0.5 mg/dL (ref 0.2–1.2)
Total Protein: 6.6 g/dL (ref 6.0–8.3)

## 2014-02-17 LAB — POCT URINALYSIS DIPSTICK
Bilirubin, UA: NEGATIVE
Blood, UA: NEGATIVE
Glucose, UA: NEGATIVE
Ketones, UA: NEGATIVE
Leukocytes, UA: NEGATIVE
Nitrite, UA: NEGATIVE
Protein, UA: NEGATIVE
Spec Grav, UA: 1.015
Urobilinogen, UA: 0.2
pH, UA: 5.5

## 2014-02-17 LAB — LIPID PANEL
Cholesterol: 253 mg/dL — ABNORMAL HIGH (ref 0–200)
HDL: 38 mg/dL — ABNORMAL LOW (ref >39)
LDL Cholesterol: 175 mg/dL — ABNORMAL HIGH (ref 0–99)
Total CHOL/HDL Ratio: 6.7 Ratio
Triglycerides: 200 mg/dL — ABNORMAL HIGH (ref <150)
VLDL: 40 mg/dL (ref 0–40)

## 2014-02-17 LAB — IFOBT (OCCULT BLOOD): IFOBT: NEGATIVE

## 2014-02-17 MED ORDER — NAPROXEN 500 MG PO TABS: 500.0000 mg | ORAL_TABLET | Freq: Two times a day (BID) | ORAL | Status: DC

## 2014-02-17 MED ORDER — MONTELUKAST SODIUM 10 MG PO TABS: 10.0000 mg | ORAL_TABLET | Freq: Every day | ORAL | Status: DC

## 2014-02-17 NOTE — Progress Notes (Addendum)
Subjective:    [name], male    DOB: 05-25-1946, 68 y.o.   MRN: [id number]  This chart was scribed for Wardell Honour, MD by Maree Erie, ED Scribe. The patient was seen in room 22. Patient's care was started at 11:12 AM.  Chief Complaint  Patient presents with  . Annual Exam    PCP: Reginia Forts, MD  HPI  Carl Henry is a 68 y.o. male who presents to office for a complete physical exam/Initial Annual Wellness Exam. This is his first CPE since being on Medicare. It took him a few weeks to get it approved.   Last CPE: April 2014, at [facility]  Colonoscopy: 2014, normal, performed at [facility], repeat in 10 years. Pneumonia vaccination: 08/2013. Flu shot: 08/2013 Tetanus vaccination: 03/04/2008 Hep A Series: Complete Shingles Vaccination: has not received it and is still considering if he wants or not. Eye exam: two-three years ago, seen at Clinic in [facility], no galucoma or cataracts Dental exam: seen in [place] before he retired Bone Density: 2014, normal as far as he can remember Dermatology: he is seen regularly and denies history of skin cancer.    Right Ankle, hip, back Pain: He was seen by Dr. Linna Darner and Dr. Ouida Sills in 10/2013 for right ankle pain that "radiated up to his right hip and into his back." He changed mattresses at home with complete relief of the symptoms until four days ago. He had been walking more and was doing well until this relapse. He is not sure if the pain is due to sleeping weird the past few days. He has been taking Ibuprofen with transient relief for hours. He denies numbness and tingling in the leg with this episode but states that with the prior episode he had numbness to the bottom of his foot. He denies weakness, urinary or bowel incontinence or ankle swelling. He has also noticed some pain in his left shoulder.   Bad breath: He has not had his tonsils out and believes this is causing him to have  bad breath. He has not noticed any white spots on his tonsils.   Sinusitis: He has had three sinus surgeries for this.   Hyperlipidemia: He ran out of Lipitor and needs a refill. He denies chest pain, shortness of breath, persistent cough.   Health Maintenance: He denies constipation, diarrhea, blood in stool, nausea, indigestion, heart burn, frequency, erectile dysfunction or decreased sex drive. He goes to bed around 9 PM and wakes up around 5 AM. He denies any issues with insomnia.    Family History: His mother was 32 when she died of osteoporosis. He denies she had any pertinent medical history that he knows of. He has five sisters and two step sisters and one brother. One of his sisters has COPD but smoked at a young age.    Social History: He has been married 43 years. He has a daughter that has two grandsons and lives in [place]. He has twin sons. One is in [place] and just adopted a little girl and the other is in [place]. His wife is expecting a little boy in June, for a total of six grandchildren. He worked at [employer]. 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 He denies smoking or alcohol use. He is walking currently. He used to run but states his joints have been unable  to handle running recently. He and his wife are currently taking care of his father. He states his wife tells him he snores but does not stop breathing at night. He denies any hospitalizations.  Medications: He takes a baby aspirin everyday. He has not taken his Lipitor because he ran out. He is not using the Flonase right now but is using Nasacort OTC. He also ran out of the Singulair. He is is taking Loratadine instead right now. He is also not taking Naproxen.  Patient Active Problem List   Diagnosis Date Noted  . HYPERLIPIDEMIA 01/11/2007  . RHINITIS, ALLERGIC 01/11/2007   Past Medical History  Diagnosis Date  .  Allergy   . Hyperlipidemia    History reviewed. No pertinent past surgical history. Allergies  Allergen Reactions  . Latex    Prior to Admission medications   Medication Sig Start Date End Date Taking? Authorizing Provider  aspirin 81 MG tablet Take 81 mg by mouth daily.    Historical Provider, MD  cyclobenzaprine (FLEXERIL) 10 MG tablet TAKE 1 TABLET (10 MG TOTAL) BY MOUTH 3 (THREE) TIMES DAILY AS NEEDED FOR MUSCLE SPASMS. 11/04/13   Posey Boyer, MD  fluticasone (FLONASE) 50 MCG/ACT nasal spray Place 2 sprays into the nose daily. 09/02/13   Eleanore Kurtis Bushman, PA-C  HYDROcodone-acetaminophen (NORCO) 5-325 MG per tablet Take 1-2 tablets by mouth every 4 (four) hours as needed for moderate pain. 10/11/13   Ellison Carwin, MD  montelukast (SINGULAIR) 10 MG tablet Take 10 mg by mouth at bedtime.    Historical Provider, MD  naproxen sodium (ANAPROX DS) 550 MG tablet Take 1 tablet (550 mg total) by mouth 2 (two) times daily with a meal. 11/08/13 11/08/14  Chelle S Jeffery, PA-C  oxyCODONE-acetaminophen (PERCOCET) 7.5-325 MG per tablet Take 1 tablet by mouth every 4 (four) hours as needed for pain. 10/16/13   Posey Boyer, MD  predniSONE (DELTASONE) 20 MG tablet 3 pills daily for 3 days, then 2 pills daily for 3 days, then one pill daily for 3 days then one half pill daily 10/16/13   Posey Boyer, MD  rosuvastatin (CRESTOR) 10 MG tablet Take 10 mg by mouth daily.    Historical Provider, MD   History   Social History  . Marital Status: Single    Spouse Name: N/A    Number of Children: N/A  . Years of Education: N/A   Occupational History  . Not on file.   Social History Main Topics  . Smoking status: Never Smoker   . Smokeless tobacco: Not on file  . Alcohol Use: Not on file  . Drug Use: Not on file  . Sexual Activity: Not on file   Other Topics Concern  . Not on file   Social History Narrative  . No narrative on file    Review of Systems  Constitutional: Negative for  fever.  HENT: Negative for dental problem, drooling and tinnitus.   Eyes: Negative for discharge and visual disturbance.  Respiratory: Negative for cough.   Cardiovascular: Negative for chest pain and leg swelling.  Gastrointestinal: Negative for vomiting, diarrhea, constipation and blood in stool.  Endocrine: Negative for polyuria.  Genitourinary: Negative for hematuria.  Musculoskeletal: Positive for arthralgias, back pain and myalgias. Negative for gait problem and neck pain.  Skin: Negative for rash.  Allergic/Immunologic: Negative for immunocompromised state.  Neurological: Negative for dizziness, speech difficulty and weakness.  Hematological: Negative for adenopathy.  Psychiatric/Behavioral: Negative for confusion and sleep disturbance.  Objective:   Physical Exam  Nursing note and vitals reviewed. Constitutional: He is oriented to person, place, and time. [name]. No distress.  HENT:  Head: Normocephalic and atraumatic.  Right Ear: Tympanic membrane, external ear and ear canal normal.  Left Ear: Tympanic membrane, external ear and ear canal normal.  Nose: Nose normal.  Mouth/Throat: Oropharynx is clear and moist. No oropharyngeal exudate.  Eyes: Conjunctivae and EOM are normal. Pupils are equal, round, and reactive to light.  Neck: Normal range of motion. Neck supple. No tracheal deviation present. No thyromegaly present.  Cardiovascular: Normal rate, regular rhythm and normal heart sounds.  Exam reveals no gallop and no friction rub.   No murmur heard. Pulmonary/Chest: Effort normal and breath sounds normal. No respiratory distress. He has no wheezes. He has no rales.  Abdominal: Soft. Bowel sounds are normal. He exhibits no distension. There is no tenderness. There is no rebound and no guarding. Hernia confirmed negative in the right inguinal area and confirmed negative in the left inguinal area.  Genitourinary: Rectum normal,  prostate normal, testes normal and penis normal. Right testis shows no mass, no swelling and no tenderness. Left testis shows no mass, no swelling and no tenderness. Circumcised. No phimosis, paraphimosis, hypospadias, penile erythema or penile tenderness. No discharge found.  Musculoskeletal: Normal range of motion.  Full ROM of right hip with reproducible pain in buttocks.   Lymphadenopathy:    He has no cervical adenopathy.       Right: No inguinal adenopathy present.       Left: No inguinal adenopathy present.  Neurological: He is alert and oriented to person, place, and time. No cranial nerve deficit. He exhibits normal muscle tone. Coordination normal.  Skin: Skin is warm and dry. No rash noted. No erythema. No pallor.  Psychiatric: He has a normal mood and affect. His behavior is normal. Judgment and thought content normal.   [facility] reading (PRIMARY) by Dr. Tamala Julian: Lumbar films: +spurring and mild degenerative changes.  EKG: NSR; no ST changes.     Assessment & Plan:  Routine general medical examination at a health care facility - Plan: CBC with Differential, COMPLETE METABOLIC PANEL WITH GFR, Lipid panel, POCT urinalysis dipstick, EKG 12-Lead, PSA, Medicare  Pure hypercholesterolemia - Plan: Lipid panel  Screening PSA (prostate specific antigen) - Plan: PSA, Medicare  Screening for diabetes mellitus  Lumbago - Plan: DG Lumbar Spine Complete  Special screening for malignant neoplasms, colon - Plan: IFOBT POC (occult bld, rslt in office)  1.  Initial Annual Wellness Exam:  Anticipatory guidance provided --- exercise, weight loss.  Colonoscopy UTD.  Immunizations discussed; pt declined rx for Zostavax. Independent with ADLs; no hearing loss. Low fall risk.  No evidence of depression.   2.  Prostate cancer screening: PSA obtained; DRE performed.  Asymptomatic. 3.  Hyperlipidemia: uncontrolled currently due to non-compliance with Lipitor; obtain labs; will restart Lipitor if  warranted. 4.  Screening for DMII: obtain glucose. 5.  R sciatica:  New.  Rx for Naproxen provided; home exercise program provided to perform daily; to call in 2-3 weeks if persistent symptoms and will refer to ortho. 6.  Colon cancer screening: colonoscopy UTD by report; obtain hemosure. 7.  Allergic Rhinitis: controlled; rx for Singulair provided; continue Loratadine and Nasacort AQ.  I personally performed the services described in this documentation, which was scribed in my presence.  The recorded information has been reviewed and is accurate.  Reginia Forts, M.D.  [facility] [address] [place], [place]  [address] [phone] phone [phone] fax

## 2014-02-17 NOTE — Patient Instructions (Signed)
1.  Please call in 2-3 weeks if you pain is not improved.  Sciatica with Rehab The sciatic nerve runs from the back down the leg and is responsible for sensation and control of the muscles in the back (posterior) side of the thigh, lower leg, and foot. Sciatica is a condition that is characterized by inflammation of this nerve.  SYMPTOMS   Signs of nerve damage, including numbness and/or weakness along the posterior side of the lower extremity.  Pain in the back of the thigh that may also travel down the leg.  Pain that worsens when sitting for long periods of time.  Occasionally, pain in the back or buttock. CAUSES  Inflammation of the sciatic nerve is the cause of sciatica. The inflammation is due to something irritating the nerve. Common sources of irritation include:  Sitting for long periods of time.  Direct trauma to the nerve.  Arthritis of the spine.  Herniated or ruptured disk.  Slipping of the vertebrae (spondylolithesis)  Pressure from soft tissues, such as muscles or ligament-like tissue (fascia). RISK INCREASES WITH:  Sports that place pressure or stress on the spine (football or weightlifting).  Poor strength and flexibility.  Failure to warm-up properly before activity.  Family history of low back pain or disk disorders.  Previous back injury or surgery.  Poor body mechanics, especially when lifting, or poor posture. PREVENTION   Warm up and stretch properly before activity.  Maintain physical fitness:  Strength, flexibility, and endurance.  Cardiovascular fitness.  Learn and use proper technique, especially with posture and lifting. When possible, have coach correct improper technique.  Avoid activities that place stress on the spine. PROGNOSIS If treated properly, then sciatica usually resolves within 6 weeks. However, occasionally surgery is necessary.  RELATED COMPLICATIONS   Permanent nerve damage, including pain, numbness, tingle, or  weakness.  Chronic back pain.  Risks of surgery: infection, bleeding, nerve damage, or damage to surrounding tissues. TREATMENT Treatment initially involves resting from any activities that aggravate your symptoms. The use of ice and medication may help reduce pain and inflammation. The use of strengthening and stretching exercises may help reduce pain with activity. These exercises may be performed at home or with referral to a therapist. A therapist may recommend further treatments, such as transcutaneous electronic nerve stimulation (TENS) or ultrasound. Your caregiver may recommend corticosteroid injections to help reduce inflammation of the sciatic nerve. If symptoms persist despite non-surgical (conservative) treatment, then surgery may be recommended. MEDICATION  If pain medication is necessary, then nonsteroidal anti-inflammatory medications, such as aspirin and ibuprofen, or other minor pain relievers, such as acetaminophen, are often recommended.  Do not take pain medication for 7 days before surgery.  Prescription pain relievers may be given if deemed necessary by your caregiver. Use only as directed and only as much as you need.  Ointments applied to the skin may be helpful.  Corticosteroid injections may be given by your caregiver. These injections should be reserved for the most serious cases, because they may only be given a certain number of times. HEAT AND COLD  Cold treatment (icing) relieves pain and reduces inflammation. Cold treatment should be applied for 10 to 15 minutes every 2 to 3 hours for inflammation and pain and immediately after any activity that aggravates your symptoms. Use ice packs or massage the area with a piece of ice (ice massage).  Heat treatment may be used prior to performing the stretching and strengthening activities prescribed by your caregiver, physical therapist, or  Product/process development scientist. Use a heat pack or soak the injury in warm water. SEEK MEDICAL  CARE IF:  Treatment seems to offer no benefit, or the condition worsens.  Any medications produce adverse side effects. EXERCISES  RANGE OF MOTION (ROM) AND STRETCHING EXERCISES - Sciatica Most people with sciatic will find that their symptoms worsen with either excessive bending forward (flexion) or arching at the low back (extension). The exercises which will help resolve your symptoms will focus on the opposite motion. Your physician, physical therapist or athletic trainer will help you determine which exercises will be most helpful to resolve your low back pain. Do not complete any exercises without first consulting with your clinician. Discontinue any exercises which worsen your symptoms until you speak to your clinician. If you have pain, numbness or tingling which travels down into your buttocks, leg or foot, the goal of the therapy is for these symptoms to move closer to your back and eventually resolve. Occasionally, these leg symptoms will get better, but your low back pain may worsen; this is typically an indication of progress in your rehabilitation. Be certain to be very alert to any changes in your symptoms and the activities in which you participated in the 24 hours prior to the change. Sharing this information with your clinician will allow him/her to most efficiently treat your condition. These exercises may help you when beginning to rehabilitate your injury. Your symptoms may resolve with or without further involvement from your physician, physical therapist or athletic trainer. While completing these exercises, remember:   Restoring tissue flexibility helps normal motion to return to the joints. This allows healthier, less painful movement and activity.  An effective stretch should be held for at least 30 seconds.  A stretch should never be painful. You should only feel a gentle lengthening or release in the stretched tissue. FLEXION RANGE OF MOTION AND STRETCHING  EXERCISES: STRETCH  Flexion, Single Knee to Chest   Lie on a firm bed or floor with both legs extended in front of you.  Keeping one leg in contact with the floor, bring your opposite knee to your chest. Hold your leg in place by either grabbing behind your thigh or at your knee.  Pull until you feel a gentle stretch in your low back. Hold __________ seconds.  Slowly release your grasp and repeat the exercise with the opposite side. Repeat __________ times. Complete this exercise __________ times per day.  STRETCH  Flexion, Double Knee to Chest  Lie on a firm bed or floor with both legs extended in front of you.  Keeping one leg in contact with the floor, bring your opposite knee to your chest.  Tense your stomach muscles to support your back and then lift your other knee to your chest. Hold your legs in place by either grabbing behind your thighs or at your knees.  Pull both knees toward your chest until you feel a gentle stretch in your low back. Hold __________ seconds.  Tense your stomach muscles and slowly return one leg at a time to the floor. Repeat __________ times. Complete this exercise __________ times per day.  STRETCH  Low Trunk Rotation   Lie on a firm bed or floor. Keeping your legs in front of you, bend your knees so they are both pointed toward the ceiling and your feet are flat on the floor.  Extend your arms out to the side. This will stabilize your upper body by keeping your shoulders in contact with the  floor.  Gently and slowly drop both knees together to one side until you feel a gentle stretch in your low back. Hold for __________ seconds.  Tense your stomach muscles to support your low back as you bring your knees back to the starting position. Repeat the exercise to the other side. Repeat __________ times. Complete this exercise __________ times per day  EXTENSION RANGE OF MOTION AND FLEXIBILITY EXERCISES: STRETCH  Extension, Prone on Elbows  Lie on your  stomach on the floor, a bed will be too soft. Place your palms about shoulder width apart and at the height of your head.  Place your elbows under your shoulders. If this is too painful, stack pillows under your chest.  Allow your body to relax so that your hips drop lower and make contact more completely with the floor.  Hold this position for __________ seconds.  Slowly return to lying flat on the floor. Repeat __________ times. Complete this exercise __________ times per day.  RANGE OF MOTION  Extension, Prone Press Ups  Lie on your stomach on the floor, a bed will be too soft. Place your palms about shoulder width apart and at the height of your head.  Keeping your back as relaxed as possible, slowly straighten your elbows while keeping your hips on the floor. You may adjust the placement of your hands to maximize your comfort. As you gain motion, your hands will come more underneath your shoulders.  Hold this position __________ seconds.  Slowly return to lying flat on the floor. Repeat __________ times. Complete this exercise __________ times per day.  STRENGTHENING EXERCISES - Sciatica  These exercises may help you when beginning to rehabilitate your injury. These exercises should be done near your "sweet spot." This is the neutral, low-back arch, somewhere between fully rounded and fully arched, that is your least painful position. When performed in this safe range of motion, these exercises can be used for people who have either a flexion or extension based injury. These exercises may resolve your symptoms with or without further involvement from your physician, physical therapist or athletic trainer. While completing these exercises, remember:   Muscles can gain both the endurance and the strength needed for everyday activities through controlled exercises.  Complete these exercises as instructed by your physician, physical therapist or athletic trainer. Progress with the  resistance and repetition exercises only as your caregiver advises.  You may experience muscle soreness or fatigue, but the pain or discomfort you are trying to eliminate should never worsen during these exercises. If this pain does worsen, stop and make certain you are following the directions exactly. If the pain is still present after adjustments, discontinue the exercise until you can discuss the trouble with your clinician. STRENGTHENING Deep Abdominals, Pelvic Tilt   Lie on a firm bed or floor. Keeping your legs in front of you, bend your knees so they are both pointed toward the ceiling and your feet are flat on the floor.  Tense your lower abdominal muscles to press your low back into the floor. This motion will rotate your pelvis so that your tail bone is scooping upwards rather than pointing at your feet or into the floor.  With a gentle tension and even breathing, hold this position for __________ seconds. Repeat __________ times. Complete this exercise __________ times per day.  STRENGTHENING  Abdominals, Crunches   Lie on a firm bed or floor. Keeping your legs in front of you, bend your knees so they are  both pointed toward the ceiling and your feet are flat on the floor. Cross your arms over your chest.  Slightly tip your chin down without bending your neck.  Tense your abdominals and slowly lift your trunk high enough to just clear your shoulder blades. Lifting higher can put excessive stress on the low back and does not further strengthen your abdominal muscles.  Control your return to the starting position. Repeat __________ times. Complete this exercise __________ times per day.  STRENGTHENING  Quadruped, Opposite UE/LE Lift  Assume a hands and knees position on a firm surface. Keep your hands under your shoulders and your knees under your hips. You may place padding under your knees for comfort.  Find your neutral spine and gently tense your abdominal muscles so that you  can maintain this position. Your shoulders and hips should form a rectangle that is parallel with the floor and is not twisted.  Keeping your trunk steady, lift your right hand no higher than your shoulder and then your left leg no higher than your hip. Make sure you are not holding your breath. Hold this position __________ seconds.  Continuing to keep your abdominal muscles tense and your back steady, slowly return to your starting position. Repeat with the opposite arm and leg. Repeat __________ times. Complete this exercise __________ times per day.  STRENGTHENING  Abdominals and Quadriceps, Straight Leg Raise   Lie on a firm bed or floor with both legs extended in front of you.  Keeping one leg in contact with the floor, bend the other knee so that your foot can rest flat on the floor.  Find your neutral spine, and tense your abdominal muscles to maintain your spinal position throughout the exercise.  Slowly lift your straight leg off the floor about 6 inches for a count of 15, making sure to not hold your breath.  Still keeping your neutral spine, slowly lower your leg all the way to the floor. Repeat this exercise with each leg __________ times. Complete this exercise __________ times per day. POSTURE AND BODY MECHANICS CONSIDERATIONS - Sciatica Keeping correct posture when sitting, standing or completing your activities will reduce the stress put on different body tissues, allowing injured tissues a chance to heal and limiting painful experiences. The following are general guidelines for improved posture. Your physician or physical therapist will provide you with any instructions specific to your needs. While reading these guidelines, remember:  The exercises prescribed by your provider will help you have the flexibility and strength to maintain correct postures.  The correct posture provides the optimal environment for your joints to work. All of your joints have less wear and tear  when properly supported by a spine with good posture. This means you will experience a healthier, less painful body.  Correct posture must be practiced with all of your activities, especially prolonged sitting and standing. Correct posture is as important when doing repetitive low-stress activities (typing) as it is when doing a single heavy-load activity (lifting). RESTING POSITIONS Consider which positions are most painful for you when choosing a resting position. If you have pain with flexion-based activities (sitting, bending, stooping, squatting), choose a position that allows you to rest in a less flexed posture. You would want to avoid curling into a fetal position on your side. If your pain worsens with extension-based activities (prolonged standing, working overhead), avoid resting in an extended position such as sleeping on your stomach. Most people will find more comfort when they rest with  their spine in a more neutral position, neither too rounded nor too arched. Lying on a non-sagging bed on your side with a pillow between your knees, or on your back with a pillow under your knees will often provide some relief. Keep in mind, being in any one position for a prolonged period of time, no matter how correct your posture, can still lead to stiffness. PROPER SITTING POSTURE In order to minimize stress and discomfort on your spine, you must sit with correct posture Sitting with good posture should be effortless for a healthy body. Returning to good posture is a gradual process. Many people can work toward this most comfortably by using various supports until they have the flexibility and strength to maintain this posture on their own. When sitting with proper posture, your ears will fall over your shoulders and your shoulders will fall over your hips. You should use the back of the chair to support your upper back. Your low back will be in a neutral position, just slightly arched. You may place a  small pillow or folded towel at the base of your low back for support.  When working at a desk, create an environment that supports good, upright posture. Without extra support, muscles fatigue and lead to excessive strain on joints and other tissues. Keep these recommendations in mind: CHAIR:   A chair should be able to slide under your desk when your back makes contact with the back of the chair. This allows you to work closely.  The chair's height should allow your eyes to be level with the upper part of your monitor and your hands to be slightly lower than your elbows. BODY POSITION  Your feet should make contact with the floor. If this is not possible, use a foot rest.  Keep your ears over your shoulders. This will reduce stress on your neck and low back. INCORRECT SITTING POSTURES   If you are feeling tired and unable to assume a healthy sitting posture, do not slouch or slump. This puts excessive strain on your back tissues, causing more damage and pain. Healthier options include:  Using more support, like a lumbar pillow.  Switching tasks to something that requires you to be upright or walking.  Talking a brief walk.  Lying down to rest in a neutral-spine position. PROLONGED STANDING WHILE SLIGHTLY LEANING FORWARD  When completing a task that requires you to lean forward while standing in one place for a long time, place either foot up on a stationary 2-4 inch high object to help maintain the best posture. When both feet are on the ground, the low back tends to lose its slight inward curve. If this curve flattens (or becomes too large), then the back and your other joints will experience too much stress, fatigue more quickly and can cause pain.  CORRECT STANDING POSTURES Proper standing posture should be assumed with all daily activities, even if they only take a few moments, like when brushing your teeth. As in sitting, your ears should fall over your shoulders and your shoulders  should fall over your hips. You should keep a slight tension in your abdominal muscles to brace your spine. Your tailbone should point down to the ground, not behind your body, resulting in an over-extended swayback posture.  INCORRECT STANDING POSTURES  Common incorrect standing postures include a forward head, locked knees and/or an excessive swayback. WALKING Walk with an upright posture. Your ears, shoulders and hips should all line-up. PROLONGED ACTIVITY IN A  FLEXED POSITION When completing a task that requires you to bend forward at your waist or lean over a low surface, try to find a way to stabilize 3 of 4 of your limbs. You can place a hand or elbow on your thigh or rest a knee on the surface you are reaching across. This will provide you more stability so that your muscles do not fatigue as quickly. By keeping your knees relaxed, or slightly bent, you will also reduce stress across your low back. CORRECT LIFTING TECHNIQUES DO :   Assume a wide stance. This will provide you more stability and the opportunity to get as close as possible to the object which you are lifting.  Tense your abdominals to brace your spine; then bend at the knees and hips. Keeping your back locked in a neutral-spine position, lift using your leg muscles. Lift with your legs, keeping your back straight.  Test the weight of unknown objects before attempting to lift them.  Try to keep your elbows locked down at your sides in order get the best strength from your shoulders when carrying an object.  Always ask for help when lifting heavy or awkward objects. INCORRECT LIFTING TECHNIQUES DO NOT:   Lock your knees when lifting, even if it is a small object.  Bend and twist. Pivot at your feet or move your feet when needing to change directions.  Assume that you cannot safely pick up a paperclip without proper posture. Document Released: 10/31/2005 Document Revised: 01/23/2012 Document Reviewed:  02/12/2009 [facility] Patient Information 2014 [place], [place].

## 2014-02-18 LAB — PSA, MEDICARE: PSA: 1.45 ng/mL (ref <=4.00)

## 2014-02-19 ENCOUNTER — Encounter: Payer: Self-pay | Admitting: Family Medicine

## 2014-02-19 MED ORDER — ATORVASTATIN CALCIUM 20 MG PO TABS: 20.0000 mg | ORAL_TABLET | Freq: Every day | ORAL | Status: DC

## 2014-02-19 NOTE — Addendum Note (Signed)
Addended by: Wardell Honour on: 02/19/2014 01:09 PM   Modules accepted: Orders

## 2014-03-19 ENCOUNTER — Encounter: Payer: Self-pay | Admitting: Family Medicine

## 2014-04-29 ENCOUNTER — Telehealth: Payer: Self-pay

## 2014-08-25 ENCOUNTER — Telehealth: Payer: Self-pay | Admitting: Urgent Care

## 2014-08-25 ENCOUNTER — Encounter: Payer: Self-pay | Admitting: Family Medicine

## 2014-08-25 ENCOUNTER — Ambulatory Visit (INDEPENDENT_AMBULATORY_CARE_PROVIDER_SITE_OTHER): Payer: Medicare Other

## 2014-08-25 ENCOUNTER — Ambulatory Visit (INDEPENDENT_AMBULATORY_CARE_PROVIDER_SITE_OTHER): Payer: Medicare Other | Admitting: Family Medicine

## 2014-08-25 ENCOUNTER — Telehealth: Payer: Self-pay

## 2014-08-25 VITALS — BP 128/78 | HR 62 | Temp 98.0°F | Resp 16 | Ht 70.5 in | Wt 200.4 lb

## 2014-08-25 DIAGNOSIS — J309 Allergic rhinitis, unspecified: Secondary | ICD-10-CM

## 2014-08-25 DIAGNOSIS — M25512 Pain in left shoulder: Secondary | ICD-10-CM

## 2014-08-25 DIAGNOSIS — Z23 Encounter for immunization: Secondary | ICD-10-CM

## 2014-08-25 DIAGNOSIS — S46912A Strain of unspecified muscle, fascia and tendon at shoulder and upper arm level, left arm, initial encounter: Secondary | ICD-10-CM

## 2014-08-25 DIAGNOSIS — E785 Hyperlipidemia, unspecified: Secondary | ICD-10-CM

## 2014-08-25 LAB — COMPLETE METABOLIC PANEL WITH GFR
ALT: 19 U/L (ref 0–53)
AST: 19 U/L (ref 0–37)
Albumin: 4.4 g/dL (ref 3.5–5.2)
Alkaline Phosphatase: 85 U/L (ref 39–117)
BUN: 16 mg/dL (ref 6–23)
CO2: 24 mEq/L (ref 19–32)
Calcium: 9.3 mg/dL (ref 8.4–10.5)
Chloride: 108 mEq/L (ref 96–112)
Creat: 0.9 mg/dL (ref 0.50–1.35)
GFR, Est African American: >89 mL/min
GFR, Est Non African American: 89 mL/min
Glucose, Bld: 100 mg/dL — ABNORMAL HIGH (ref 70–99)
Potassium: 4.4 mEq/L (ref 3.5–5.3)
Sodium: 140 mEq/L (ref 135–145)
Total Bilirubin: 0.7 mg/dL (ref 0.2–1.2)
Total Protein: 6.8 g/dL (ref 6.0–8.3)

## 2014-08-25 LAB — CBC WITH DIFFERENTIAL/PLATELET
Basophils Absolute: 0.1 10*3/uL (ref 0.0–0.1)
Basophils Relative: 1 % (ref 0–1)
Eosinophils Absolute: 0.1 10*3/uL (ref 0.0–0.7)
Eosinophils Relative: 2 % (ref 0–5)
HCT: 46.1 % (ref 39.0–52.0)
Hemoglobin: 15.6 g/dL (ref 13.0–17.0)
Lymphocytes Relative: 23 % (ref 12–46)
Lymphs Abs: 1.6 10*3/uL (ref 0.7–4.0)
MCH: 28.7 pg (ref 26.0–34.0)
MCHC: 33.8 g/dL (ref 30.0–36.0)
MCV: 84.7 fL (ref 78.0–100.0)
Monocytes Absolute: 0.6 10*3/uL (ref 0.1–1.0)
Monocytes Relative: 8 % (ref 3–12)
Neutro Abs: 4.6 10*3/uL (ref 1.7–7.7)
Neutrophils Relative %: 66 % (ref 43–77)
Platelets: 243 10*3/uL (ref 150–400)
RBC: 5.44 MIL/uL (ref 4.22–5.81)
RDW: 14 % (ref 11.5–15.5)
WBC: 7 10*3/uL (ref 4.0–10.5)

## 2014-08-25 LAB — LIPID PANEL
Cholesterol: 192 mg/dL (ref 0–200)
HDL: 45 mg/dL (ref >39)
LDL Cholesterol: 105 mg/dL — ABNORMAL HIGH (ref 0–99)
Total CHOL/HDL Ratio: 4.3 Ratio
Triglycerides: 208 mg/dL — ABNORMAL HIGH (ref <150)
VLDL: 42 mg/dL — ABNORMAL HIGH (ref 0–40)

## 2014-08-25 MED ORDER — MONTELUKAST SODIUM 10 MG PO TABS: 10.0000 mg | ORAL_TABLET | Freq: Every day | ORAL | Status: DC

## 2014-08-25 MED ORDER — ATORVASTATIN CALCIUM 20 MG PO TABS: 20.0000 mg | ORAL_TABLET | Freq: Every day | ORAL | Status: DC

## 2014-08-25 NOTE — Patient Instructions (Signed)

## 2014-08-25 NOTE — Progress Notes (Signed)
Subjective:    [name], male    DOB: [birth year], 68 y.o.   MRN: [id number]  HPI  Carl Henry presents for 6 month follow-up on his HL and new onset shoulder pain.  HL - reports compliance with medication. Denies side effects from medications including myalgias, fogginess. Needs a refill on his rosuvastatin. Admits, mild legs camps very occasionally when he's lying down, resolves with walking and drinking water. Walks daily, 30 minutes, reports intermittent mild pain while walking. Admits that he doesn't always remember to wear his walking shoes. Diet is not as "good as it should be", eats plenty of sweets, fatty meals; he is cooking more but does not cook healthy foods.   Shoulder pain - reports intermittent shoulder pain since 06/2014 following yard-work on 1 acre of land using just a weed eater. Carl Henry admits that the work was very strenuous on his shoulder and now yardwork aggravates his shoulder, feels tightness and limited ROM. Pain relieved with Alleve. Denies radiation of pain, numbness/tingling in arm, point tenderness, swelling, redness, neck pain, scapular pain. No other aggravating or relieving factors.  Sciatica - reports significant improvement with his sciatica since he changed his mattress cover. Would like to discuss recommendations for type of mattress he should use.  Health Maintenance - would like to have the Prevnar 13 and flu shot today otherwise is up to date on immunizations.  Review of Systems As in subjective.    Objective:   Physical Exam  Constitutional: [name].  Neck: Normal range of motion and full passive range of motion without pain. Neck supple. No muscular tenderness present.  Cardiovascular: Normal rate, regular rhythm and normal heart sounds.  Exam reveals no gallop and no friction rub.   No murmur heard. Pulmonary/Chest: Effort normal and breath sounds normal. No respiratory distress. He has  no wheezes.  Musculoskeletal:       Left shoulder: He exhibits decreased range of motion (external rotation), tenderness and pain (mild discomfort with Luan Pulling, Neers tests). He exhibits no bony tenderness, no swelling, no crepitus and no deformity.  Skin: Skin is warm and dry. No rash noted.   [facility] reading (PRIMARY) by  Dr. Tamala Julian and PA-Bob Eastwood, no evidence of dislocation or fracture, signs of degenerative changes present in [facility] joint and Glenohumeral joint, consider calcification of axillary lymph nodes otherwise no other soft tissue abnormalities.  Dg Shoulder Left  08/25/2014   CLINICAL DATA:  Left shoulder pain for 3 months.  EXAM: LEFT SHOULDER - 2+ VIEW  COMPARISON:  01/31/2009 chest radiographs  FINDINGS: There is no evidence of acute fracture or dislocation. Multiple round and ovoid calcified densities project in the regions of the superior subscapularis and axillary recesses, likely reflecting intra-articular loose bodies. There is glenohumeral joint space narrowing and moderate marginal osteophyte formation. No lytic or blastic osseous lesion is identified.  IMPRESSION: 1. No acute osseous abnormality identified. 2. 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   Electronically Signed   By: Logan Bores   On: 08/25/2014 12:44       Assessment & Plan:   Carl Henry is presenting here today for f/u on his hyperlipidemia and new onset shoulder pain. Overall he's doing well, repeat labs today, refill medications, will follow up in 6 months.  1. Hyperlipidemia Repeat labs today, continue medication and exercise, counseled on healthier eating habits, consider nutritional consult - CBC with Differential - COMPLETE METABOLIC PANEL WITH GFR - Lipid panel -  atorvastatin (LIPITOR) 20 MG tablet; Take 1 tablet (20 mg total) by mouth daily.  Dispense: 90 tablet; Refill: 1  2. Allergic rhinitis, unspecified allergic rhinitis type Stable, refill Singulair today -  montelukast (SINGULAIR) 10 MG tablet; Take 1 tablet (10 mg total) by mouth at bedtime.  Dispense: 90 tablet; Refill: 3  3. Pain in joint, shoulder region, left Pain secondary to degenerative changes in shoulder and overuse, continue Alleve PRN, ice shoulder, shoulder exercises 3 times a day, consider capsaicin or menthol cream if no significant improvement, monitor - DG Shoulder Left; Future  4. Flu vaccine need 5. Need for vaccination with 13-polyvalent pneumococcal conjugate vaccine Completed today  Jaynee Eagles, PA-C [facility] [phone] 08/25/2014 9:15 AM

## 2014-08-25 NOTE — Progress Notes (Signed)
History and physical examinations obtained with Rosario Adie, PA-C.  L shoulder xray reviewed. Agree with assessment and plan.  Home exercise program provided to perform daily for L shoulder strain.  If no improvement in one month, call for SM or ortho referral.

## 2014-08-25 NOTE — Telephone Encounter (Signed)
Encounter opened in error

## 2014-08-26 ENCOUNTER — Ambulatory Visit (INDEPENDENT_AMBULATORY_CARE_PROVIDER_SITE_OTHER): Payer: Medicare Other | Admitting: Family Medicine

## 2014-08-26 VITALS — BP 116/68 | HR 65 | Temp 98.7°F | Resp 18 | Ht 70.0 in | Wt 201.4 lb

## 2014-08-26 DIAGNOSIS — M545 Low back pain: Secondary | ICD-10-CM

## 2014-08-26 DIAGNOSIS — S39012A Strain of muscle, fascia and tendon of lower back, initial encounter: Secondary | ICD-10-CM

## 2014-08-26 MED ORDER — MELOXICAM 7.5 MG PO TABS: 7.5000 mg | ORAL_TABLET | Freq: Every day | ORAL | Status: DC

## 2014-08-26 MED ORDER — CYCLOBENZAPRINE HCL 5 MG PO TABS: ORAL_TABLET | ORAL | Status: DC

## 2014-08-26 NOTE — Patient Instructions (Signed)
You likely have a sprained ligament or strained muscle in the low back, which can lead to some muscle spasm as well. Try the mobic each morning (do not combine with other over the counter pain relievers), flexeril at night if needed - be careful with sedation with this medicine.  Heat or ice to area as needed and the other treatments and exercises in the back care manual as tolerated. If not improving in next week, or worsening sooner - return for recheck and xrays.  Return to the clinic or go to the nearest emergency room if any of your symptoms worsen or new symptoms occur.  Lumbosacral Strain Lumbosacral strain is a strain of any of the parts that make up your lumbosacral vertebrae. Your lumbosacral vertebrae are the bones that make up the lower third of your backbone. Your lumbosacral vertebrae are held together by muscles and tough, fibrous tissue (ligaments).  CAUSES  A sudden blow to your back can cause lumbosacral strain. Also, anything that causes an excessive stretch of the muscles in the low back can cause this strain. This is typically seen when people exert themselves strenuously, fall, lift heavy objects, bend, or crouch repeatedly. RISK FACTORS  Physically demanding work.  Participation in pushing or pulling sports or sports that require a sudden twist of the back (tennis, golf, baseball).  Weight lifting.  Excessive lower back curvature.  Forward-tilted pelvis.  Weak back or abdominal muscles or both.  Tight hamstrings. SIGNS AND SYMPTOMS  Lumbosacral strain may cause pain in the area of your injury or pain that moves (radiates) down your leg.  DIAGNOSIS Your health care provider can often diagnose lumbosacral strain through a physical exam. In some cases, you may need tests such as X-ray exams.  TREATMENT  Treatment for your lower back injury depends on many factors that your clinician will have to evaluate. However, most treatment will include the use of anti-inflammatory  medicines. HOME CARE INSTRUCTIONS   Avoid hard physical activities (tennis, racquetball, waterskiing) if you are not in proper physical condition for it. This may aggravate or create problems.  If you have a back problem, avoid sports requiring sudden body movements. Swimming and walking are generally safer activities.  Maintain good posture.  Maintain a healthy weight.  For acute conditions, you may put ice on the injured area.  Put ice in a plastic bag.  Place a towel between your skin and the bag.  Leave the ice on for 20 minutes, 2-3 times a day.  When the low back starts healing, stretching and strengthening exercises may be recommended. SEEK MEDICAL CARE IF:  Your back pain is getting worse.  You experience severe back pain not relieved with medicines. SEEK IMMEDIATE MEDICAL CARE IF:   You have numbness, tingling, weakness, or problems with the use of your arms or legs.  There is a change in bowel or bladder control.  You have increasing pain in any area of the body, including your belly (abdomen).  You notice shortness of breath, dizziness, or feel faint.  You feel sick to your stomach (nauseous), are throwing up (vomiting), or become sweaty.  You notice discoloration of your toes or legs, or your feet get very cold. MAKE SURE YOU:   Understand these instructions.  Will watch your condition.  Will get help right away if you are not doing well or get worse. Document Released: 08/10/2005 Document Revised: 11/05/2013 Document Reviewed: 06/19/2013 [employer] Patient Information 2015 [place], [place]. This information is not intended to  replace advice given to you by your health care provider. Make sure you discuss any questions you have with your health care provider.  

## 2014-08-26 NOTE — Progress Notes (Signed)
Subjective:  This chart was scribed for Merri Ray, MD by Randa Evens, ED Scribe. This Patient was seen in room 12 and the patients care was started at 4:58 PM   [name], male    DOB: February 01, 1946, 68 y.o.   MRN: [id number] . Chief Complaint  Patient presents with  . Back Pain    lower back pain  x last night -[facility]-    HPI HPI Comments: Carl Henry is a 68 y.o. male who presents to the [facility] complaining of constant sharp stabbing low back pain onset 1 day prior. Pt states that he was bent over changing a ink cartridge when he first noticed the pain. He states that the pain is in the low back and radiates out to both sides near the hips. He denies feeling or hearing a pop. He states he has tried ice and aleve with no relief. He states that this morning the pain was still there and improved with movement throughout the day. He states that he has a guarded gait due to trying to keep the back pain to a minimum. He states that he has injured his mid back previously that got better with light back exercises.  Denies numbness, bowel/bladder incontinence or weakness.  Denies Hx of heart disease or ulcer disease.  He states that he recently saw Dr. Tamala Julian 1 day ago for shoulder pain. 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  He was also seen by Dr. Bess Harvest 1 day ago.     Patient Active Problem List   Diagnosis Date Noted  . Pure hypercholesterolemia 02/17/2014  . Allergic rhinitis 02/17/2014  . HYPERLIPIDEMIA 01/11/2007  . RHINITIS, ALLERGIC 01/11/2007   Past Medical History  Diagnosis Date  . Allergy   . Hyperlipidemia   . Nasal polyps    Past Surgical History  Procedure Laterality Date  . Colonoscopy  04/09/13    Two polyps.  Outlaw/[facility].  Repeat 5 years.  . Nasal sinus surgery  11/15/2003    x 3.  [name].  . Nasal polyp surgery     Allergies  Allergen Reactions  . Latex    Prior to Admission medications     Medication Sig Start Date End Date Taking? Authorizing Provider  aspirin 81 MG tablet Take 81 mg by mouth daily.   Yes Historical Provider, MD  montelukast (SINGULAIR) 10 MG tablet Take 1 tablet (10 mg total) by mouth at bedtime. 08/25/14  Yes Jaynee Eagles, PA-C  PRESCRIPTION MEDICATION Nasacort nasal spray taking prn   Yes Historical Provider, MD  rosuvastatin (CRESTOR) 10 MG tablet Take 10 mg by mouth daily.   Yes Historical Provider, MD   History   Social History  . Marital Status: Single    Spouse Name: N/A    Number of Children: 3  . Years of Education: N/A   Occupational History  . Retired    Social History Main Topics  . Smoking status: Never Smoker   . Smokeless tobacco: Never Used  . Alcohol Use: No  . Drug Use: No  . Sexual Activity: Yes    Partners: Female   Other Topics Concern  . Not on file   Social History Narrative   Marital status: married x 43 years.      Children: 3 children (twin sons); 6 grandchildren.      Lives: with wife, father.      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      Tobacco: none      Alcohol: none currently; stopped several years ago.       Exercise: walking daily.       Seatbelt: 100%          Review of Systems  Musculoskeletal: Positive for back pain.     Objective:   Filed Vitals:   08/26/14 1631  BP: 116/68  Pulse: 65  Temp: 98.7 F (37.1 C)  TempSrc: Oral  Resp: 18  Height: 5\' 10"  (1.778 m)  Weight: 201 lb 6.4 oz (91.354 kg)  SpO2: 97%     Physical Exam  Nursing note and vitals reviewed. Constitutional: He is oriented to person, place, and time. [name]. No distress.  HENT:  Head: Normocephalic and atraumatic.  Eyes: Conjunctivae and EOM are normal.  Neck: Neck supple. No tracheal deviation present.  Cardiovascular: Normal rate.   Pulmonary/Chest: Effort normal. No respiratory distress.   Musculoskeletal: Normal range of motion.  Lower lumbar approximately L4-L5 location of pain that spread to SI joint bilaterally, mid line tenderness approximately L3-L4, SI non tender, sciatic notch non tender back, ROM flexion 80 degrees, equal lateral flexion, equal rotation, negative seated SLR pain in back only.  Neurological: He is alert and oriented to person, place, and time. He displays no Babinski's sign on the right side. He displays no Babinski's sign on the left side.  Reflex Scores:      Patellar reflexes are 2+ on the right side and 2+ on the left side.      Achilles reflexes are 2+ on the right side and 2+ on the left side. Skin: Skin is warm and dry.  Psychiatric: He has a normal mood and affect. His behavior is normal.     Assessment & Plan:   Carl Henry is a 68 y.o. male Low back strain, initial encounter - Plan: cyclobenzaprine (FLEXERIL) 5 MG tablet  Low back pain without sciatica, unspecified back pain laterality - Plan: cyclobenzaprine (FLEXERIL) 5 MG tablet  Minimal force injury, but occurred with forward bending - strain vs. HNP.  Some improvement today and reassuring exam as above without red flags. XR discussed, but decided to defer at this point with above.   -trial of mobic QD, flexeril QHS prn spasm (SED), range of motion and other sx care as below. Recheck in next week if not improving, and would obtain xr then, sooner if worse. Understanding expressed.   Meds ordered this encounter  Medications  . meloxicam (MOBIC) 7.5 MG tablet    Sig: Take 1 tablet (7.5 mg total) by mouth daily.    Dispense:  30 tablet    Refill:  0  . cyclobenzaprine (FLEXERIL) 5 MG tablet    Sig: 1 pill by mouth up to every 8 hours as needed. Start with one pill by mouth each bedtime as needed due to sedation    Dispense:  15 tablet    Refill:  0   Patient Instructions  You likely have a sprained ligament or strained muscle in the low back, which can lead to some muscle  spasm as well. Try the mobic each morning (do not combine with other over the counter pain relievers), flexeril at night if needed - be careful with sedation with this medicine.  Heat or ice to area as needed and the other treatments and exercises in the  back care manual as tolerated. If not improving in next week, or worsening sooner - return for recheck and xrays.  Return to the clinic or go to the nearest emergency room if any of your symptoms worsen or new symptoms occur.  Lumbosacral Strain Lumbosacral strain is a strain of any of the parts that make up your lumbosacral vertebrae. Your lumbosacral vertebrae are the bones that make up the lower third of your backbone. Your lumbosacral vertebrae are held together by muscles and tough, fibrous tissue (ligaments).  CAUSES  A sudden blow to your back can cause lumbosacral strain. Also, anything that causes an excessive stretch of the muscles in the low back can cause this strain. This is typically seen when people exert themselves strenuously, fall, lift heavy objects, bend, or crouch repeatedly. RISK FACTORS  Physically demanding work.  Participation in pushing or pulling sports or sports that require a sudden twist of the back (tennis, golf, baseball).  Weight lifting.  Excessive lower back curvature.  Forward-tilted pelvis.  Weak back or abdominal muscles or both.  Tight hamstrings. SIGNS AND SYMPTOMS  Lumbosacral strain may cause pain in the area of your injury or pain that moves (radiates) down your leg.  DIAGNOSIS Your health care provider can often diagnose lumbosacral strain through a physical exam. In some cases, you may need tests such as X-ray exams.  TREATMENT  Treatment for your lower back injury depends on many factors that your clinician will have to evaluate. However, most treatment will include the use of anti-inflammatory medicines. HOME CARE INSTRUCTIONS   Avoid hard physical activities (tennis, racquetball,  waterskiing) if you are not in proper physical condition for it. This may aggravate or create problems.  If you have a back problem, avoid sports requiring sudden body movements. Swimming and walking are generally safer activities.  Maintain good posture.  Maintain a healthy weight.  For acute conditions, you may put ice on the injured area.  Put ice in a plastic bag.  Place a towel between your skin and the bag.  Leave the ice on for 20 minutes, 2-3 times a day.  When the low back starts healing, stretching and strengthening exercises may be recommended. SEEK MEDICAL CARE IF:  Your back pain is getting worse.  You experience severe back pain not relieved with medicines. SEEK IMMEDIATE MEDICAL CARE IF:   You have numbness, tingling, weakness, or problems with the use of your arms or legs.  There is a change in bowel or bladder control.  You have increasing pain in any area of the body, including your belly (abdomen).  You notice shortness of breath, dizziness, or feel faint.  You feel sick to your stomach (nauseous), are throwing up (vomiting), or become sweaty.  You notice discoloration of your toes or legs, or your feet get very cold. MAKE SURE YOU:   Understand these instructions.  Will watch your condition.  Will get help right away if you are not doing well or get worse. Document Released: 08/10/2005 Document Revised: 11/05/2013 Document Reviewed: 06/19/2013 [facility] Patient Information 2015 [place], [place]. This information is not intended to replace advice given to you by your health care provider. Make sure you discuss any questions you have with your health care provider.     I personally performed the services described in this documentation, which was scribed in my presence. The recorded information has been reviewed and considered, and addended by me as needed.

## 2014-08-29 ENCOUNTER — Other Ambulatory Visit: Payer: Self-pay

## 2015-02-25 ENCOUNTER — Encounter: Payer: Medicare Other | Admitting: Family Medicine

## 2015-03-18 ENCOUNTER — Encounter: Payer: Self-pay | Admitting: Family Medicine

## 2015-03-18 ENCOUNTER — Ambulatory Visit (INDEPENDENT_AMBULATORY_CARE_PROVIDER_SITE_OTHER): Payer: Medicare Other | Admitting: Family Medicine

## 2015-03-18 VITALS — BP 90/60 | HR 61 | Temp 98.3°F | Resp 16 | Ht 70.0 in | Wt 199.4 lb

## 2015-03-18 DIAGNOSIS — Z131 Encounter for screening for diabetes mellitus: Secondary | ICD-10-CM | POA: Diagnosis not present

## 2015-03-18 DIAGNOSIS — J301 Allergic rhinitis due to pollen: Secondary | ICD-10-CM

## 2015-03-18 DIAGNOSIS — Z125 Encounter for screening for malignant neoplasm of prostate: Secondary | ICD-10-CM | POA: Diagnosis not present

## 2015-03-18 DIAGNOSIS — E7800 Pure hypercholesterolemia, unspecified: Secondary | ICD-10-CM

## 2015-03-18 DIAGNOSIS — J309 Allergic rhinitis, unspecified: Secondary | ICD-10-CM | POA: Diagnosis not present

## 2015-03-18 DIAGNOSIS — Z0000 Encounter for general adult medical examination without abnormal findings: Secondary | ICD-10-CM

## 2015-03-18 DIAGNOSIS — L989 Disorder of the skin and subcutaneous tissue, unspecified: Secondary | ICD-10-CM | POA: Diagnosis not present

## 2015-03-18 DIAGNOSIS — M25512 Pain in left shoulder: Secondary | ICD-10-CM | POA: Diagnosis not present

## 2015-03-18 LAB — POCT URINALYSIS DIPSTICK
Bilirubin, UA: NEGATIVE
Blood, UA: NEGATIVE
Glucose, UA: NEGATIVE
Ketones, UA: NEGATIVE
Leukocytes, UA: NEGATIVE
Nitrite, UA: NEGATIVE
Protein, UA: NEGATIVE
Spec Grav, UA: 1.025
Urobilinogen, UA: 0.2
pH, UA: 5

## 2015-03-18 MED ORDER — MELOXICAM 7.5 MG PO TABS: 7.5000 mg | ORAL_TABLET | Freq: Every day | ORAL | Status: DC

## 2015-03-18 MED ORDER — ROSUVASTATIN CALCIUM 10 MG PO TABS: 10.0000 mg | ORAL_TABLET | Freq: Every day | ORAL | Status: DC

## 2015-03-18 MED ORDER — MONTELUKAST SODIUM 10 MG PO TABS: 10.0000 mg | ORAL_TABLET | Freq: Every day | ORAL | Status: DC

## 2015-03-18 NOTE — Progress Notes (Signed)
Subjective:    [name], male    DOB: Mar 14, 1946, 69 y.o.   MRN: [id number]  03/18/2015  Annual Exam   HPI This 69 y.o. male presents for Medicare Annual Wellness Exam.  Last physical:  02-17-14. Colonoscopy: 2014; normal; [facility]; repeat in 10 years. TDAP:  03/04/2008 Pneumovax:  08/2013; Prevnar 13  08/25/2014 Zostavax:  Never; considering. Influenza:  08/25/14 Eye exam:  +glasses; no glaucoma or cataracts; two years ago. Dental exam:  Today.  Hyperlipidemia: Patient reports good compliance with medication, good tolerance to medication, and good symptom control.    Allergic Rhinitis: Patient reports good compliance with medication, good tolerance to medication, and good symptom control.    L Shoulder pain:  Improved after last visit and has now recurred for the past four months. 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  Skin changes: Last dermatology evaluation four years ago; having some skin changes.   Review of Systems  Constitutional: Negative for fever, chills, diaphoresis, activity change, appetite change, fatigue and unexpected weight change.  HENT: Negative for congestion, dental problem, drooling, ear discharge, ear pain, facial swelling, hearing loss, mouth sores, nosebleeds, postnasal drip, rhinorrhea, sinus pressure, sneezing, sore throat, tinnitus, trouble swallowing and voice change.   Eyes: Negative for photophobia, pain, discharge, redness, itching and visual disturbance.  Respiratory: Negative for apnea, cough, choking, chest tightness, shortness of breath, wheezing and stridor.   Cardiovascular: Negative for chest pain, palpitations and leg swelling.  Gastrointestinal: Negative for nausea, vomiting, abdominal pain, diarrhea, constipation and blood in stool.  Endocrine: Negative for cold intolerance, heat intolerance, polydipsia, polyphagia and polyuria.    Genitourinary: Negative for dysuria, urgency, frequency, hematuria, flank pain, decreased urine volume, discharge, penile swelling, scrotal swelling, enuresis, difficulty urinating, genital sores, penile pain and testicular pain.  Musculoskeletal: Positive for arthralgias. Negative for myalgias, back pain, joint swelling, gait problem, neck pain and neck stiffness.  Skin: Positive for color change. Negative for pallor, rash and wound.  Allergic/Immunologic: Negative for environmental allergies, food allergies and immunocompromised state.  Neurological: Negative for dizziness, tremors, seizures, syncope, facial asymmetry, speech difficulty, weakness, light-headedness, numbness and headaches.  Hematological: Negative for adenopathy. Does not bruise/bleed easily.  Psychiatric/Behavioral: Negative for suicidal ideas, hallucinations, behavioral problems, confusion, sleep disturbance, self-injury, dysphoric mood, decreased concentration and agitation. The patient is not nervous/anxious and is not hyperactive.     Past Medical History  Diagnosis Date  . Allergy   . Hyperlipidemia   . Nasal polyps    Past Surgical History  Procedure Laterality Date  . Colonoscopy  04/09/13    Two polyps.  Outlaw/[facility].  Repeat 5 years.  . Nasal sinus surgery  11/15/2003    x 3.  [name].  . Nasal polyp surgery     Allergies  Allergen Reactions  . Latex    Current Outpatient Prescriptions  Medication Sig Dispense Refill  . aspirin 81 MG tablet Take 81 mg by mouth daily.    . montelukast (SINGULAIR) 10 MG tablet Take 1 tablet (10 mg total) by mouth at bedtime. 90 tablet 3  . PRESCRIPTION MEDICATION Nasacort nasal spray taking prn    . cyclobenzaprine (FLEXERIL) 5 MG tablet 1 pill by mouth up to every 8 hours as needed. Start with one pill by mouth each bedtime as needed due to sedation (Patient not taking: Reported on 03/18/2015) 15 tablet 0  . meloxicam (MOBIC) 7.5 MG tablet Take 1  tablet (7.5 mg total) by mouth  daily. 30 tablet 1  . rosuvastatin (CRESTOR) 10 MG tablet Take 1 tablet (10 mg total) by mouth daily. 90 tablet 3   No current facility-administered medications for this visit.   History   Social History  . Marital Status: Single    Spouse Name: N/A  . Number of Children: 3  . Years of Education: N/A   Occupational History  . Retired    Social History Main Topics  . Smoking status: Never Smoker   . Smokeless tobacco: Never Used  . Alcohol Use: No  . Drug Use: No  . Sexual Activity:    Partners: Female   Other Topics Concern  . Not on file   Social History Narrative   Marital status: married x 44 years.      Children: 3 children (twin sons); 6 grandchildren.      Lives: with wife, father.      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      Tobacco: none      Alcohol: none currently; stopped several years ago.       Exercise: walking daily.       Seatbelt: 100%      ADLs: independent with all ADLs; drives; no assistant devices for ambulation.      Advance Directives: +LIVING WILL; FULL CODE; no prolonged measures.         Family History  Problem Relation Age of Onset  . Osteoporosis Mother   . Hyperlipidemia Father   . Hypertension Father   . Arthritis Father   . Depression Father   . COPD Sister        Objective:    BP 90/60 mmHg  Pulse 61  Temp(Src) 98.3 F (36.8 C) (Oral)  Resp 16  Ht 5\' 10"  (1.778 m)  Wt 199 lb 6.4 oz (90.447 kg)  BMI 28.61 kg/m2  SpO2 96% Physical Exam  Constitutional: He is oriented to person, place, and time. [name]. No distress.  HENT:  Head: Normocephalic and atraumatic.  Right Ear: External ear normal.  Left Ear: External ear normal.  Nose: Nose normal.  Mouth/Throat: Oropharynx is clear and moist.  Eyes: Conjunctivae and EOM are normal. Pupils are equal, round, and reactive to light.  Neck: Normal range of  motion. Neck supple. Carotid bruit is not present. No thyromegaly present.  Cardiovascular: Normal rate, regular rhythm, normal heart sounds and intact distal pulses.  Exam reveals no gallop and no friction rub.   No murmur heard. Pulmonary/Chest: Effort normal and breath sounds normal. He has no wheezes. He has no rales.  Abdominal: Soft. Bowel sounds are normal. He exhibits no distension and no mass. There is no tenderness. There is no rebound and no guarding. Hernia confirmed negative in the right inguinal area and confirmed negative in the left inguinal area.  Genitourinary: Rectum normal, prostate normal, testes normal and penis normal. Rectal exam shows no external hemorrhoid. Prostate is not enlarged and not tender. Right testis shows no mass, no swelling and no tenderness. Left testis shows no mass, no swelling and no tenderness. Circumcised.  Musculoskeletal:       Right shoulder: Normal.       Left shoulder: He exhibits pain. He exhibits normal range of motion, no tenderness, no bony tenderness, no spasm, normal pulse and normal strength.  Cervical back: Normal.  Lymphadenopathy:    He has no cervical adenopathy.       Right: No inguinal adenopathy present.       Left: No inguinal adenopathy present.  Neurological: He is alert and oriented to person, place, and time. He has normal reflexes. No cranial nerve deficit. He exhibits normal muscle tone. Coordination normal.  Skin: Skin is warm, dry and intact. Rash noted. Rash is maculopapular. He is not diaphoretic. No cyanosis. Nails show no clubbing.  Scattered lesions along trunk with color variation.  Psychiatric: He has a normal mood and affect. His behavior is normal. Judgment and thought content normal.   Results for orders placed or performed in visit on 03/18/15  POCT urinalysis dipstick  Result Value Ref Range   Color, UA Amber    Clarity, UA Clear    Glucose, UA Negative    Bilirubin, UA Negative    Ketones, UA Negative     Spec Grav, UA 1.025    Blood, UA Negative    pH, UA 5.0    Protein, UA negative    Urobilinogen, UA 0.2    Nitrite, UA Negative    Leukocytes, UA Negative        Assessment & Plan:   1. Encounter for Medicare annual wellness exam   2. Allergic rhinitis due to pollen   3. Pure hypercholesterolemia   4. Encounter for prostate cancer screening   5. Allergic rhinitis, unspecified allergic rhinitis type   6. Pain in left shoulder   7. Skin lesion     1. Medicare Annual Wellness Examination: anticipatory guidance --- exercise, weight loss. Immunizations reviewed -- pt declined Zostavax. Colonoscopy UTD.  Independent with ADLs.  Low fall risk; no evidence of depression; no hearing loss; no urinary incontinence. +advanced directives; desires full code. 2.  Hyperlipidemia: controlled; obtain labs; refill provided. 3. Allergic Rhinitis: controlled; refill of Singulair provided. 4.  Prostate cancer screening: DRE completed; obtain PSA. 5.  Screening for diabetes: obtain glucose. 6.  L shoulder pain: recurrent; rx for Mobic provided; recommend ortho consultation; pt desires to make own appointment. 7. Skin lesions: with color variation; pt desires to schedule own dermatology evaluation.     Meds ordered this encounter  Medications  . meloxicam (MOBIC) 7.5 MG tablet    Sig: Take 1 tablet (7.5 mg total) by mouth daily.    Dispense:  30 tablet    Refill:  1  . montelukast (SINGULAIR) 10 MG tablet    Sig: Take 1 tablet (10 mg total) by mouth at bedtime.    Dispense:  90 tablet    Refill:  3  . rosuvastatin (CRESTOR) 10 MG tablet    Sig: Take 1 tablet (10 mg total) by mouth daily.    Dispense:  90 tablet    Refill:  3    Return in about 6 months (around 09/18/2015) for recheck hypercholesterolemia.     Jlyn Bracamonte Elayne Guerin, M.D. [facility] [address] [place], [place]  [address] [phone] phone [phone] fax

## 2015-03-18 NOTE — Patient Instructions (Addendum)
1. RECOMMEND DERMATOLOGY EVALUATION IN UPCOMING SIX MONTHS TO LOOK AT VARIOUS MOLES, SKIN CHANGES. 2.  ORTHOPEDIC RECOMMENDATIONS: [facility], [facility].  Keeping you healthy  Get these tests  Blood pressure- Have your blood pressure checked once a year by your healthcare provider.  Normal blood pressure is 120/80  Weight- Have your body mass index (BMI) calculated to screen for obesity.  BMI is a measure of body fat based on height and weight. You can also calculate your own BMI at ViewBanking.si.  Cholesterol- Have your cholesterol checked every year.  Diabetes- Have your blood sugar checked regularly if you have high blood pressure, high cholesterol, have a family history of diabetes or if you are overweight.  Screening for Colon Cancer- Colonoscopy starting at age 26.  Screening may begin sooner depending on your family history and other health conditions. Follow up colonoscopy as directed by your Gastroenterologist.  Screening for Prostate Cancer- Both blood work (PSA) and a rectal exam help screen for Prostate Cancer.  Screening begins at age 40 with African-American men and at age 68 with Caucasian men.  Screening may begin sooner depending on your family history.  Take these medicines  Aspirin- One aspirin daily can help prevent Heart disease and Stroke.  Flu shot- Every fall.  Tetanus- Every 10 years.  Zostavax- Once after the age of 55 to prevent Shingles.  Pneumonia shot- Once after the age of 73; if you are younger than 3, ask your healthcare provider if you need a Pneumonia shot.  Take these steps  Don't smoke- If you do smoke, talk to your doctor about quitting.  For tips on how to quit, go to www.smokefree.gov or call 1-800-QUIT-NOW.  Be physically active- Exercise 5 days a week for at least 30 minutes.  If you are not already physically active start slow and gradually work up to 30 minutes of moderate physical activity.  Examples  of moderate activity include walking briskly, mowing the yard, dancing, swimming, bicycling, etc.  Eat a healthy diet- Eat a variety of healthy food such as fruits, vegetables, low fat milk, low fat cheese, yogurt, lean meant, poultry, fish, beans, tofu, etc. For more information go to www.thenutritionsource.org  Drink alcohol in moderation- Limit alcohol intake to less than two drinks a day. Never drink and drive.  Dentist- Brush and floss twice daily; visit your dentist twice a year.  Depression- Your emotional health is as important as your physical health. If you're feeling down, or losing interest in things you would normally enjoy please talk to your healthcare provider.  Eye exam- Visit your eye doctor every year.  Safe sex- If you may be exposed to a sexually transmitted infection, use a condom.  Seat belts- Seat belts can save your life; always wear one.  Smoke/Carbon Monoxide detectors- These detectors need to be installed on the appropriate level of your home.  Replace batteries at least once a year.  Skin cancer- When out in the sun, cover up and use sunscreen 15 SPF or higher.  Violence- If anyone is threatening you, please tell your healthcare provider.  Living Will/ Health care power of attorney- Speak with your healthcare provider and family.

## 2015-03-19 LAB — COMPREHENSIVE METABOLIC PANEL
ALT: 24 U/L (ref 0–53)
AST: 23 U/L (ref 0–37)
Albumin: 4.5 g/dL (ref 3.5–5.2)
Alkaline Phosphatase: 68 U/L (ref 39–117)
BUN: 17 mg/dL (ref 6–23)
CO2: 28 mEq/L (ref 19–32)
Calcium: 9.5 mg/dL (ref 8.4–10.5)
Chloride: 106 mEq/L (ref 96–112)
Creat: 0.93 mg/dL (ref 0.50–1.35)
Glucose, Bld: 88 mg/dL (ref 70–99)
Potassium: 4.9 mEq/L (ref 3.5–5.3)
Sodium: 142 mEq/L (ref 135–145)
Total Bilirubin: 0.9 mg/dL (ref 0.2–1.2)
Total Protein: 7 g/dL (ref 6.0–8.3)

## 2015-03-19 LAB — CBC WITH DIFFERENTIAL/PLATELET
Basophils Absolute: 0 10*3/uL (ref 0.0–0.1)
Basophils Relative: 0 % (ref 0–1)
Eosinophils Absolute: 0.1 10*3/uL (ref 0.0–0.7)
Eosinophils Relative: 2 % (ref 0–5)
HCT: 46.4 % (ref 39.0–52.0)
Hemoglobin: 15.5 g/dL (ref 13.0–17.0)
Lymphocytes Relative: 23 % (ref 12–46)
Lymphs Abs: 1.5 10*3/uL (ref 0.7–4.0)
MCH: 28.8 pg (ref 26.0–34.0)
MCHC: 33.4 g/dL (ref 30.0–36.0)
MCV: 86.1 fL (ref 78.0–100.0)
MPV: 9.6 fL (ref 8.6–12.4)
Monocytes Absolute: 0.6 10*3/uL (ref 0.1–1.0)
Monocytes Relative: 9 % (ref 3–12)
Neutro Abs: 4.4 10*3/uL (ref 1.7–7.7)
Neutrophils Relative %: 66 % (ref 43–77)
Platelets: 245 10*3/uL (ref 150–400)
RBC: 5.39 MIL/uL (ref 4.22–5.81)
RDW: 14.1 % (ref 11.5–15.5)
WBC: 6.7 10*3/uL (ref 4.0–10.5)

## 2015-03-19 LAB — LIPID PANEL
Cholesterol: 169 mg/dL (ref 0–200)
HDL: 43 mg/dL (ref >=40)
LDL Cholesterol: 103 mg/dL — ABNORMAL HIGH (ref 0–99)
Total CHOL/HDL Ratio: 3.9 Ratio
Triglycerides: 113 mg/dL (ref <150)
VLDL: 23 mg/dL (ref 0–40)

## 2015-03-19 LAB — PSA, MEDICARE: PSA: 2.31 ng/mL (ref <=4.00)

## 2015-03-23 ENCOUNTER — Telehealth: Payer: Self-pay

## 2015-03-23 NOTE — Telephone Encounter (Signed)
Pt states his insurance is refusing to pay for his Crestor anymore so he need it to be changed to something else Please call [phone]    [facility] [address]

## 2015-03-25 NOTE — Telephone Encounter (Signed)
I spoke with pt, he states he received a letter from his insurance. Venita Lick he may need a prior auth, can you check on this and see what is going on?

## 2015-03-26 NOTE — Telephone Encounter (Signed)
Sp[facility], his Crestor is going through as a paid claim. I am snot sure what letter he received and why.

## 2015-03-30 ENCOUNTER — Other Ambulatory Visit: Payer: Self-pay | Admitting: Urgent Care

## 2015-05-26 ENCOUNTER — Telehealth: Payer: Self-pay

## 2015-05-26 NOTE — Telephone Encounter (Signed)
Pt has been out on vacation and just realized he is out of his atorvastatin, 90-day supply.  He is visiting in [place], [place] now and needs [facility] sent to [facility].  Their number is [phone].  His number is [phone]

## 2015-05-27 MED ORDER — ATORVASTATIN CALCIUM 20 MG PO TABS: ORAL_TABLET | ORAL | Status: DC

## 2015-05-27 NOTE — Telephone Encounter (Signed)
Spoke with pt, advised Rx sent in. 

## 2015-06-24 ENCOUNTER — Other Ambulatory Visit: Payer: Self-pay | Admitting: Urgent Care

## 2015-09-21 ENCOUNTER — Encounter: Payer: Self-pay | Admitting: Family Medicine

## 2015-09-21 ENCOUNTER — Ambulatory Visit (INDEPENDENT_AMBULATORY_CARE_PROVIDER_SITE_OTHER): Payer: Medicare Other | Admitting: Family Medicine

## 2015-09-21 VITALS — BP 123/69 | HR 65 | Temp 98.2°F | Resp 16 | Ht 70.25 in | Wt 204.0 lb

## 2015-09-21 DIAGNOSIS — M7581 Other shoulder lesions, right shoulder: Secondary | ICD-10-CM | POA: Diagnosis not present

## 2015-09-21 DIAGNOSIS — Z23 Encounter for immunization: Secondary | ICD-10-CM

## 2015-09-21 DIAGNOSIS — Z1159 Encounter for screening for other viral diseases: Secondary | ICD-10-CM

## 2015-09-21 DIAGNOSIS — J309 Allergic rhinitis, unspecified: Secondary | ICD-10-CM | POA: Diagnosis not present

## 2015-09-21 DIAGNOSIS — E7800 Pure hypercholesterolemia, unspecified: Secondary | ICD-10-CM

## 2015-09-21 LAB — COMPREHENSIVE METABOLIC PANEL
ALT: 24 U/L (ref 9–46)
AST: 22 U/L (ref 10–35)
Albumin: 4.1 g/dL (ref 3.6–5.1)
Alkaline Phosphatase: 80 U/L (ref 40–115)
BUN: 19 mg/dL (ref 7–25)
CO2: 25 mmol/L (ref 20–31)
Calcium: 8.7 mg/dL (ref 8.6–10.3)
Chloride: 108 mmol/L (ref 98–110)
Creat: 0.82 mg/dL (ref 0.70–1.25)
Glucose, Bld: 95 mg/dL (ref 65–99)
Potassium: 4.5 mmol/L (ref 3.5–5.3)
Sodium: 139 mmol/L (ref 135–146)
Total Bilirubin: 0.5 mg/dL (ref 0.2–1.2)
Total Protein: 6.5 g/dL (ref 6.1–8.1)

## 2015-09-21 LAB — LIPID PANEL
Cholesterol: 194 mg/dL (ref 125–200)
HDL: 47 mg/dL (ref >=40)
LDL Cholesterol: 120 mg/dL (ref <130)
Total CHOL/HDL Ratio: 4.1 Ratio (ref <=5.0)
Triglycerides: 135 mg/dL (ref <150)
VLDL: 27 mg/dL (ref <30)

## 2015-09-21 LAB — HEPATITIS C ANTIBODY: HCV Ab: NEGATIVE

## 2015-09-21 MED ORDER — MONTELUKAST SODIUM 10 MG PO TABS: 10.0000 mg | ORAL_TABLET | Freq: Every day | ORAL | Status: DC

## 2015-09-21 MED ORDER — ATORVASTATIN CALCIUM 20 MG PO TABS: 20.0000 mg | ORAL_TABLET | Freq: Every day | ORAL | Status: DC

## 2015-09-21 NOTE — Progress Notes (Signed)
Subjective:    [name], male    DOB: [birth year], 70 y.o.   MRN: [id number]  09/21/2015  Follow-up and Medication Refill   HPI This 69 y.o. male presents for six month follow-up:   1. Hyperlipidemia: Patient reports good compliance with medication, good tolerance to medication, and good symptom control.  Took a walk this morning.  Lipitor 66m daily.    2.  Allergic Rhinitis: Patient reports good compliance with medication, good tolerance to medication, and good symptom control.  Singulair.      3.  Requesting flu vaccine.  Has not received Zostavax; still considering.  4.  B shoulder pain:  Thinks due to pillow or mattress.  Bought cane pillow; seems helping.  Did not schedule with ortho after last visit due to improvement with change in pillow. Intermittent issue.  Raking or reaching above head.    5. Skin changes:  Did schedule dermatology appointment.  S/p biopsy that was benign.     Review of Systems  Constitutional: Negative for fever, chills, diaphoresis, activity change, appetite change and fatigue.  Respiratory: Negative for cough and shortness of breath.   Cardiovascular: Negative for chest pain, palpitations and leg swelling.  Gastrointestinal: Negative for nausea, vomiting, abdominal pain and diarrhea.  Endocrine: Negative for cold intolerance, heat intolerance, polydipsia, polyphagia and polyuria.  Skin: Negative for color change, rash and wound.  Neurological: Negative for dizziness, tremors, seizures, syncope, facial asymmetry, speech difficulty, weakness, light-headedness, numbness and headaches.  Psychiatric/Behavioral: Negative for sleep disturbance and dysphoric mood. The patient is not nervous/anxious.     Past Medical History  Diagnosis Date  . Allergy   . Hyperlipidemia   . Nasal polyps    Past Surgical History  Procedure Laterality Date  . Colonoscopy  04/09/13    Two polyps.  Outlaw/[facility].  Repeat 5 years.  . Nasal sinus  surgery  11/15/2003    x 3.  [facility]  . Nasal polyp surgery     Allergies  Allergen Reactions  . Latex Swelling    OF HANDS   Current Outpatient Prescriptions  Medication Sig Dispense Refill  . aspirin 81 MG tablet Take 81 mg by mouth daily.    . montelukast (SINGULAIR) 10 MG tablet Take 1 tablet (10 mg total) by mouth at bedtime. 90 tablet 3  . PRESCRIPTION MEDICATION Nasacort nasal spray taking prn    . atorvastatin (LIPITOR) 20 MG tablet Take 1 tablet (20 mg total) by mouth daily at 6 PM. 90 tablet 3   No current facility-administered medications for this visit.   Social History   Social History  . Marital Status: Single    Spouse Name: N/A  . Number of Children: 3  . Years of Education: N/A   Occupational History  . Retired    Social History Main Topics  . Smoking status: Never Smoker   . Smokeless tobacco: Never Used  . Alcohol Use: No  . Drug Use: No  . Sexual Activity:    Partners: Female   Other Topics Concern  . Not on file   Social History Narrative   Marital status: married x 44 years.      Children: 3 children (twin sons); 6 grandchildren.      Lives: with wife, father.      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      Tobacco: none  Alcohol: none currently; stopped several years ago.       Exercise: walking daily.       Seatbelt: 100%      ADLs: independent with all ADLs; drives; no assistant devices for ambulation.      Advance Directives: +LIVING WILL; FULL CODE; no prolonged measures.         Family History  Problem Relation Age of Onset  . Osteoporosis Mother   . Hyperlipidemia Father   . Hypertension Father   . Arthritis Father   . Depression Father   . COPD Sister   . COPD Maternal Grandfather        Objective:    BP 123/69 mmHg  Pulse 65  Temp(Src) 98.2 F (36.8 C) (Oral)  Resp 16  Ht 5' 10.25" (1.784 m)  Wt 204 lb (92.534 kg)  BMI 29.07  kg/m2 Physical Exam  Constitutional: He is oriented to person, place, and time. [name]. No distress.  HENT:  Head: Normocephalic and atraumatic.  Right Ear: External ear normal.  Left Ear: External ear normal.  Nose: Nose normal.  Mouth/Throat: Oropharynx is clear and moist.  Eyes: Conjunctivae and EOM are normal. Pupils are equal, round, and reactive to light.  Neck: Normal range of motion. Neck supple. Carotid bruit is not present. No thyromegaly present.  Cardiovascular: Normal rate, regular rhythm, normal heart sounds and intact distal pulses.  Exam reveals no gallop and no friction rub.   No murmur heard. Pulmonary/Chest: Effort normal and breath sounds normal. He has no wheezes. He has no rales.  Abdominal: Soft. Bowel sounds are normal. He exhibits no distension and no mass. There is no tenderness. There is no rebound and no guarding.  Musculoskeletal:       Right shoulder: He exhibits pain. He exhibits normal range of motion, no tenderness, no bony tenderness, normal pulse and normal strength.       Left shoulder: He exhibits pain. He exhibits normal range of motion, no tenderness, no bony tenderness, no spasm, normal pulse and normal strength.  Lymphadenopathy:    He has no cervical adenopathy.  Neurological: He is alert and oriented to person, place, and time. No cranial nerve deficit.  Skin: Skin is warm and dry. No rash noted. He is not diaphoretic.  Psychiatric: He has a normal mood and affect. His behavior is normal.  Nursing note and vitals reviewed.  Results for orders placed or performed in visit on 03/18/15  CBC with Differential/Platelet  Result Value Ref Range   WBC 6.7 4.0 - 10.5 K/uL   RBC 5.39 4.22 - 5.81 MIL/uL   Hemoglobin 15.5 13.0 - 17.0 g/dL   HCT 46.4 39.0 - 52.0 %   MCV 86.1 78.0 - 100.0 fL   MCH 28.8 26.0 - 34.0 pg   MCHC 33.4 30.0 - 36.0 g/dL   RDW 14.1 11.5 - 15.5 %   Platelets 245 150 - 400 K/uL   MPV 9.6 8.6  - 12.4 fL   Neutrophils Relative % 66 43 - 77 %   Neutro Abs 4.4 1.7 - 7.7 K/uL   Lymphocytes Relative 23 12 - 46 %   Lymphs Abs 1.5 0.7 - 4.0 K/uL   Monocytes Relative 9 3 - 12 %   Monocytes Absolute 0.6 0.1 - 1.0 K/uL   Eosinophils Relative 2 0 - 5 %   Eosinophils Absolute 0.1 0.0 - 0.7 K/uL   Basophils Relative 0 0 - 1 %   Basophils Absolute  0.0 0.0 - 0.1 K/uL   Smear Review Criteria for review not met   Comprehensive metabolic panel  Result Value Ref Range   Sodium 142 135 - 145 mEq/L   Potassium 4.9 3.5 - 5.3 mEq/L   Chloride 106 96 - 112 mEq/L   CO2 28 19 - 32 mEq/L   Glucose, Bld 88 70 - 99 mg/dL   BUN 17 6 - 23 mg/dL   Creat 0.93 0.50 - 1.35 mg/dL   Total Bilirubin 0.9 0.2 - 1.2 mg/dL   Alkaline Phosphatase 68 39 - 117 U/L   AST 23 0 - 37 U/L   ALT 24 0 - 53 U/L   Total Protein 7.0 6.0 - 8.3 g/dL   Albumin 4.5 3.5 - 5.2 g/dL   Calcium 9.5 8.4 - 10.5 mg/dL  Lipid panel  Result Value Ref Range   Cholesterol 169 0 - 200 mg/dL   Triglycerides 113 <150 mg/dL   HDL 43 >=40 mg/dL   Total CHOL/HDL Ratio 3.9 Ratio   VLDL 23 0 - 40 mg/dL   LDL Cholesterol 103 (H) 0 - 99 mg/dL  PSA, Medicare  Result Value Ref Range   PSA 2.31 <=4.00 ng/mL  POCT urinalysis dipstick  Result Value Ref Range   Color, UA Amber    Clarity, UA Clear    Glucose, UA Negative    Bilirubin, UA Negative    Ketones, UA Negative    Spec Grav, UA 1.025    Blood, UA Negative    pH, UA 5.0    Protein, UA negative    Urobilinogen, UA 0.2    Nitrite, UA Negative    Leukocytes, UA Negative        Assessment & Plan:   1. Pure hypercholesterolemia   2. Allergic rhinitis due to pollen   3. Need for hepatitis C screening test   4. Need for prophylactic vaccination and inoculation against influenza   5. Allergic rhinitis, unspecified allergic rhinitis type   6. Rotator cuff tendonitis, right     1. Hyperlipidemia: controlled; obtain labs; refill provided. 2.  Allergic Rhinitis; controlled;  refill provided. 3.  R rotator cuff tendonitis: New. Recommend rest, icing, NSAIDs. Home exercise program provided. If no improvement in 2-4 weeks, recommend SM referral.  4.  Need for hepatitis C screening: obtain. 5. S/p flu vaccine.   Orders Placed This Encounter  Procedures  . Flu Vaccine QUAD 36+ mos IM  . Comprehensive metabolic panel    Order Specific Question:  Has the patient fasted?    Answer:  Yes  . Lipid panel    Order Specific Question:  Has the patient fasted?    Answer:  Yes  . Hepatitis C antibody   Meds ordered this encounter  Medications  . atorvastatin (LIPITOR) 20 MG tablet    Sig: Take 1 tablet (20 mg total) by mouth daily at 6 PM.    Dispense:  90 tablet    Refill:  3  . montelukast (SINGULAIR) 10 MG tablet    Sig: Take 1 tablet (10 mg total) by mouth at bedtime.    Dispense:  90 tablet    Refill:  3    Return in about 6 months (around 03/20/2016) for complete physical examiniation.    Kristi Elayne Guerin, M.D. [facility] [address] [place],   [address] [phone] phone [phone] fax

## 2015-09-21 NOTE — Patient Instructions (Signed)

## 2015-12-22 ENCOUNTER — Telehealth: Payer: Self-pay

## 2016-01-21 DIAGNOSIS — S83241A Other tear of medial meniscus, current injury, right knee, initial encounter: Secondary | ICD-10-CM | POA: Diagnosis not present

## 2016-03-07 DIAGNOSIS — S83241D Other tear of medial meniscus, current injury, right knee, subsequent encounter: Secondary | ICD-10-CM | POA: Diagnosis not present

## 2016-03-15 DIAGNOSIS — M25561 Pain in right knee: Secondary | ICD-10-CM | POA: Diagnosis not present

## 2016-03-21 ENCOUNTER — Encounter: Payer: Medicare Other | Admitting: Family Medicine

## 2016-04-12 ENCOUNTER — Other Ambulatory Visit: Payer: Self-pay | Admitting: Family Medicine

## 2016-04-19 ENCOUNTER — Encounter: Payer: Self-pay | Admitting: Family Medicine

## 2016-04-19 ENCOUNTER — Ambulatory Visit (INDEPENDENT_AMBULATORY_CARE_PROVIDER_SITE_OTHER): Payer: PPO | Admitting: Family Medicine

## 2016-04-19 VITALS — BP 116/64 | HR 59 | Temp 97.8°F | Resp 16 | Ht 70.25 in | Wt 206.6 lb

## 2016-04-19 DIAGNOSIS — E663 Overweight: Secondary | ICD-10-CM | POA: Diagnosis not present

## 2016-04-19 DIAGNOSIS — J301 Allergic rhinitis due to pollen: Secondary | ICD-10-CM

## 2016-04-19 DIAGNOSIS — M67431 Ganglion, right wrist: Secondary | ICD-10-CM | POA: Diagnosis not present

## 2016-04-19 DIAGNOSIS — L989 Disorder of the skin and subcutaneous tissue, unspecified: Secondary | ICD-10-CM | POA: Diagnosis not present

## 2016-04-19 DIAGNOSIS — Z79899 Other long term (current) drug therapy: Secondary | ICD-10-CM | POA: Diagnosis not present

## 2016-04-19 DIAGNOSIS — J309 Allergic rhinitis, unspecified: Secondary | ICD-10-CM | POA: Diagnosis not present

## 2016-04-19 DIAGNOSIS — E7800 Pure hypercholesterolemia, unspecified: Secondary | ICD-10-CM

## 2016-04-19 DIAGNOSIS — Z0000 Encounter for general adult medical examination without abnormal findings: Secondary | ICD-10-CM

## 2016-04-19 DIAGNOSIS — Z131 Encounter for screening for diabetes mellitus: Secondary | ICD-10-CM

## 2016-04-19 LAB — COMPREHENSIVE METABOLIC PANEL
ALT: 17 U/L (ref 9–46)
AST: 17 U/L (ref 10–35)
Albumin: 4.5 g/dL (ref 3.6–5.1)
Alkaline Phosphatase: 73 U/L (ref 40–115)
BUN: 17 mg/dL (ref 7–25)
CO2: 24 mmol/L (ref 20–31)
Calcium: 9.1 mg/dL (ref 8.6–10.3)
Chloride: 106 mmol/L (ref 98–110)
Creat: 0.82 mg/dL (ref 0.70–1.25)
Glucose, Bld: 90 mg/dL (ref 65–99)
Potassium: 4.6 mmol/L (ref 3.5–5.3)
Sodium: 140 mmol/L (ref 135–146)
Total Bilirubin: 0.7 mg/dL (ref 0.2–1.2)
Total Protein: 6.6 g/dL (ref 6.1–8.1)

## 2016-04-19 LAB — CBC WITH DIFFERENTIAL/PLATELET
Basophils Absolute: 0 cells/uL (ref 0–200)
Basophils Relative: 0 %
Eosinophils Absolute: 118 cells/uL (ref 15–500)
Eosinophils Relative: 2 %
HCT: 45.5 % (ref 38.5–50.0)
Hemoglobin: 15.3 g/dL (ref 13.2–17.1)
Lymphocytes Relative: 23 %
Lymphs Abs: 1357 cells/uL (ref 850–3900)
MCH: 29.1 pg (ref 27.0–33.0)
MCHC: 33.6 g/dL (ref 32.0–36.0)
MCV: 86.7 fL (ref 80.0–100.0)
MPV: 9 fL (ref 7.5–12.5)
Monocytes Absolute: 531 cells/uL (ref 200–950)
Monocytes Relative: 9 %
Neutro Abs: 3894 cells/uL (ref 1500–7800)
Neutrophils Relative %: 66 %
Platelets: 262 10*3/uL (ref 140–400)
RBC: 5.25 MIL/uL (ref 4.20–5.80)
RDW: 13.9 % (ref 11.0–15.0)
WBC: 5.9 10*3/uL (ref 3.8–10.8)

## 2016-04-19 LAB — POCT URINALYSIS DIP (MANUAL ENTRY)
Bilirubin, UA: NEGATIVE
Blood, UA: NEGATIVE
Glucose, UA: NEGATIVE
Ketones, POC UA: NEGATIVE
Leukocytes, UA: NEGATIVE
Nitrite, UA: NEGATIVE
Spec Grav, UA: 1.02
Urobilinogen, UA: 1
pH, UA: 6.5

## 2016-04-19 LAB — LIPID PANEL
Cholesterol: 166 mg/dL (ref 125–200)
HDL: 44 mg/dL (ref >=40)
LDL Cholesterol: 107 mg/dL (ref <130)
Total CHOL/HDL Ratio: 3.8 Ratio (ref <=5.0)
Triglycerides: 75 mg/dL (ref <150)
VLDL: 15 mg/dL (ref <30)

## 2016-04-19 MED ORDER — ATORVASTATIN CALCIUM 20 MG PO TABS: 20.0000 mg | ORAL_TABLET | Freq: Every day | ORAL | Status: DC

## 2016-04-19 MED ORDER — MONTELUKAST SODIUM 10 MG PO TABS: 10.0000 mg | ORAL_TABLET | Freq: Every day | ORAL | Status: DC

## 2016-04-19 NOTE — Patient Instructions (Signed)
   IF you received an x-ray today, you will receive an invoice from [facility]. Please contact  Radiology at [phone] with questions or concerns regarding your invoice.   IF you received labwork today, you will receive an invoice from [facility]. Please contact [facility] at [phone] with questions or concerns regarding your invoice.   Our billing staff will not be able to assist you with questions regarding bills from these companies.  You will be contacted with the lab results as soon as they are available. The fastest way to get your results is to activate your My Chart account. Instructions are located on the last page of this paperwork. If you have not heard from us regarding the results in 2 weeks, please contact this office.    Keeping you healthy  Get these tests  Blood pressure- Have your blood pressure checked once a year by your healthcare provider.  Normal blood pressure is 120/80  Weight- Have your body mass index (BMI) calculated to screen for obesity.  BMI is a measure of body fat based on height and weight. You can also calculate your own BMI at [email or web address].com/bmi/.  Cholesterol- Have your cholesterol checked every year.  Diabetes- Have your blood sugar checked regularly if you have high blood pressure, high cholesterol, have a family history of diabetes or if you are overweight.  Screening for Colon Cancer- Colonoscopy starting at age 50.  Screening may begin sooner depending on your family history and other health conditions. Follow up colonoscopy as directed by your Gastroenterologist.  Screening for Prostate Cancer- Both blood work (PSA) and a rectal exam help screen for Prostate Cancer.  Screening begins at age 40 with African-American men and at age 50 with Caucasian men.  Screening may begin sooner depending on your family history.  Take these medicines  Aspirin- One aspirin daily can help prevent Heart  disease and Stroke.  Flu shot- Every fall.  Tetanus- Every 10 years.  Zostavax- Once after the age of 60 to prevent Shingles.  Pneumonia shot- Once after the age of 65; if you are younger than 65, ask your healthcare provider if you need a Pneumonia shot.  Take these steps  Don't smoke- If you do smoke, talk to your doctor about quitting.  For tips on how to quit, go to www.smokefree.gov or call 1-800-QUIT-NOW.  Be physically active- Exercise 5 days a week for at least 30 minutes.  If you are not already physically active start slow and gradually work up to 30 minutes of moderate physical activity.  Examples of moderate activity include walking briskly, mowing the yard, dancing, swimming, bicycling, etc.  Eat a healthy diet- Eat a variety of healthy food such as fruits, vegetables, low fat milk, low fat cheese, yogurt, lean meant, poultry, fish, beans, tofu, etc. For more information go to www.thenutritionsource.org  Drink alcohol in moderation- Limit alcohol intake to less than two drinks a day. Never drink and drive.  Dentist- Brush and floss twice daily; visit your dentist twice a year.  Depression- Your emotional health is as important as your physical health. If you're feeling down, or losing interest in things you would normally enjoy please talk to your healthcare provider.  Eye exam- Visit your eye doctor every year.  Safe sex- If you may be exposed to a sexually transmitted infection, use a condom.  Seat belts- Seat belts can save your life; always wear one.  Smoke/Carbon Monoxide detectors- These detectors need to be installed on   the appropriate level of your home.  Replace batteries at least once a year.  Skin cancer- When out in the sun, cover up and use sunscreen 15 SPF or higher.  Violence- If anyone is threatening you, please tell your healthcare provider.  Living Will/ Health care power of attorney- Speak with your healthcare provider and family. 

## 2016-04-19 NOTE — Progress Notes (Signed)
Subjective:    [name], male    DOB: 1946-07-04, 70 y.o.   MRN: [id number]  04/19/2016  Annual Exam and Medication Refill   HPI This 70 y.o. male presents for Annual Wellness Examination:  Last physical:  03-18-2015 Colonoscopy:  03/2013 TDAP:  2009 Pneumovax:  2014; 2015 Prevnar 13 Zostavax:  Never; denies Influenza:  2016 Eye exam: +glasses; two years ago; no g/c. Dental exam:  03/2016.   Hypercholesterolemia: Patient reports good compliance with medication, good tolerance to medication, and good symptom control.    Allergic Rhinitis: Patient reports good compliance with medication, good tolerance to medication, and good symptom control.    R knee strain: s/p ortho evaluation 03/2016; pinched knee; s/p MRI because lack of improvement; duration of injury four weeks.  Partial tear of meniscus, arthritis.    R forearm cyst:  Duration several months; no ipain.  R forearm skin lesion: onset a few months ago; thought was a pimple. Review of Systems  Constitutional: Negative for fever, chills, diaphoresis, activity change, appetite change, fatigue and unexpected weight change.  HENT: Negative for congestion, dental problem, drooling, ear discharge, ear pain, facial swelling, hearing loss, mouth sores, nosebleeds, postnasal drip, rhinorrhea, sinus pressure, sneezing, sore throat, tinnitus, trouble swallowing and voice change.   Eyes: Negative for photophobia, pain, discharge, redness, itching and visual disturbance.  Respiratory: Negative for apnea, cough, choking, chest tightness, shortness of breath, wheezing and stridor.   Cardiovascular: Negative for chest pain, palpitations and leg swelling.  Gastrointestinal: Negative for nausea, vomiting, abdominal pain, diarrhea, constipation and blood in stool.  Endocrine: Negative for cold intolerance, heat intolerance, polydipsia, polyphagia and polyuria.  Genitourinary: Negative for dysuria, urgency, frequency, hematuria,  flank pain, decreased urine volume, discharge, penile swelling, scrotal swelling, enuresis, difficulty urinating, genital sores, penile pain and testicular pain.       Nocturia x 3.  No weakened stream.  Good sex drive.  Erections normal.  Musculoskeletal: Negative for myalgias, back pain, joint swelling, arthralgias, gait problem, neck pain and neck stiffness.  Skin: Positive for color change. Negative for pallor, rash and wound.  Allergic/Immunologic: Negative for environmental allergies, food allergies and immunocompromised state.  Neurological: Negative for dizziness, tremors, seizures, syncope, facial asymmetry, speech difficulty, weakness, light-headedness, numbness and headaches.  Hematological: Negative for adenopathy. Does not bruise/bleed easily.  Psychiatric/Behavioral: Negative for suicidal ideas, hallucinations, behavioral problems, confusion, sleep disturbance, self-injury, dysphoric mood, decreased concentration and agitation. The patient is not nervous/anxious and is not hyperactive.        Bedtime 10:00; wakes up 6:00am.      Past Medical History  Diagnosis Date  . Allergy   . Hyperlipidemia   . Nasal polyps    Past Surgical History  Procedure Laterality Date  . Colonoscopy  04/09/13    Two polyps.  Outlaw/[facility].  Repeat 5 years.  . Nasal sinus surgery  11/15/2003    x 3.  [name].  . Nasal polyp surgery     Allergies  Allergen Reactions  . Latex Swelling    OF HANDS   Current Outpatient Prescriptions  Medication Sig Dispense Refill  . aspirin 81 MG tablet Take 81 mg by mouth daily.    [name] atorvastatin (LIPITOR) 20 MG tablet Take 1 tablet (20 mg total) by mouth daily at 6 PM. 90 tablet 3  . montelukast (SINGULAIR) 10 MG tablet Take 1 tablet (10 mg total) by mouth at bedtime. 90 tablet 3  . OVER THE COUNTER MEDICATION     .  PRESCRIPTION MEDICATION Nasacort nasal spray taking prn     No current facility-administered medications for this visit.   Social History    Social History  . Marital Status: Single    Spouse Name: N/A  . Number of Children: 3  . Years of Education: N/A   Occupational History  . Retired    Social History Main Topics  . Smoking status: Never Smoker   . Smokeless tobacco: Never Used  . Alcohol Use: No  . Drug Use: No  . Sexual Activity:    Partners: Female    Patent examiner Protection: Post-menopausal   Other Topics Concern  . Not on file   Social History Narrative   Marital status: married x 45 years.      Children: 3 children (twin sons); 6 grandchildren.      Lives: with wife, father.      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      Tobacco: none      Alcohol: none currently; stopped several years ago.       Exercise: walking daily.         Seatbelt: 100%      ADLs: independent with all ADLs; drives; no assistant devices for ambulation.      Advance Directives: +LIVING WILL; FULL CODE; no prolonged measures.         Family History  Problem Relation Age of Onset  . Osteoporosis Mother   . Hyperlipidemia Father   . Hypertension Father   . Arthritis Father   . Depression Father   . COPD Sister   . COPD Maternal Grandfather        Objective:    BP 116/64 mmHg  Pulse 59  Temp(Src) 97.8 F (36.6 C) (Oral)  Resp 16  Ht 5' 10.25" (1.784 m)  Wt 206 lb 9.6 oz (93.713 kg)  BMI 29.44 kg/m2  SpO2 98% Physical Exam  Constitutional: He is oriented to person, place, and time. [name]. No distress.  HENT:  Head: Normocephalic and atraumatic.  Right Ear: External ear normal.  Left Ear: External ear normal.  Nose: Nose normal.  Mouth/Throat: Oropharynx is clear and moist.  Eyes: Conjunctivae and EOM are normal. Pupils are equal, round, and reactive to light.  Neck: Normal range of motion. Neck supple. Carotid bruit is not present. No thyromegaly present.  Cardiovascular: Normal rate,  regular rhythm, normal heart sounds and intact distal pulses.  Exam reveals no gallop and no friction rub.   No murmur heard. Pulmonary/Chest: Effort normal and breath sounds normal. He has no wheezes. He has no rales.  Abdominal: Soft. Bowel sounds are normal. He exhibits no distension and no mass. There is no tenderness. There is no rebound and no guarding. Hernia confirmed negative in the right inguinal area and confirmed negative in the left inguinal area.  Genitourinary: Testes normal and penis normal. Right testis shows no mass, no swelling and no tenderness. Left testis shows no mass, no swelling and no tenderness. Circumcised.  No rectal performed.  Musculoskeletal:       Right shoulder: Normal.       Left shoulder: Normal.       Cervical back: Normal.  R wrist with 1 cm ganglion cystic lesion; non-tender.  Lymphadenopathy:    He has no cervical adenopathy.       Right: No inguinal adenopathy present.  Left: No inguinal adenopathy present.  Neurological: He is alert and oriented to person, place, and time. He has normal reflexes. No cranial nerve deficit. He exhibits normal muscle tone. Coordination normal.  Skin: Skin is warm and dry. No rash noted. He is not diaphoretic.  R forearm with 104mm hypertrophied firm papular lesion.  Psychiatric: He has a normal mood and affect. His behavior is normal. Judgment and thought content normal.   Results for orders placed or performed in visit on 09/21/15  Comprehensive metabolic panel  Result Value Ref Range   Sodium 139 135 - 146 mmol/L   Potassium 4.5 3.5 - 5.3 mmol/L   Chloride 108 98 - 110 mmol/L   CO2 25 20 - 31 mmol/L   Glucose, Bld 95 65 - 99 mg/dL   BUN 19 7 - 25 mg/dL   Creat 0.82 0.70 - 1.25 mg/dL   Total Bilirubin 0.5 0.2 - 1.2 mg/dL   Alkaline Phosphatase 80 40 - 115 U/L   AST 22 10 - 35 U/L   ALT 24 9 - 46 U/L   Total Protein 6.5 6.1 - 8.1 g/dL   Albumin 4.1 3.6 - 5.1 g/dL   Calcium 8.7 8.6 - 10.3 mg/dL  Lipid  panel  Result Value Ref Range   Cholesterol 194 125 - 200 mg/dL   Triglycerides 135 <150 mg/dL   HDL 47 >=40 mg/dL   Total CHOL/HDL Ratio 4.1 <=5.0 Ratio   VLDL 27 <30 mg/dL   LDL Cholesterol 120 <130 mg/dL  Hepatitis C antibody  Result Value Ref Range   HCV Ab NEGATIVE NEGATIVE   Depression screen [facility] 2/9 04/19/2016 09/21/2015 03/18/2015 02/17/2014  Decreased Interest 0 0 0 0  Down, Depressed, Hopeless 0 0 0 0  PHQ - 2 Score 0 0 0 0        Assessment & Plan:   1. Routine physical examination   2. Pure hypercholesterolemia   3. Allergic rhinitis due to pollen   4. Screening for diabetes mellitus   5. Allergic rhinitis, unspecified allergic rhinitis type   6. Ganglion cyst of wrist, right   7. Skin lesion of right arm    -anticipatory guidance -- weight loss, exercise. -colonoscopy UTD. -immunizations reviewed; pt declined Zostavax. -independent with ADLs; no evidence of depression; no hearing loss.  Low fall risk.   -has advanced directives; FULL CODE; no prolonged measures. -Hypercholesterolemia: controlled; obtain labs; refill provided; RTC six months. -allergic rhinitis: controlled; refill provided. -R forearm skin lesion: New; recommend dermatology evaluation. -R ganglion cyst of wrist: New; asymptomatic; reassurance provided.   Orders Placed This Encounter  Procedures  . CBC with Differential/Platelet  . Comprehensive metabolic panel    Order Specific Question:  Has the patient fasted?    Answer:  Yes  . Lipid panel    Order Specific Question:  Has the patient fasted?    Answer:  Yes  . POCT urinalysis dipstick   Meds ordered this encounter  Medications  . OVER THE COUNTER MEDICATION    Sig:   . atorvastatin (LIPITOR) 20 MG tablet    Sig: Take 1 tablet (20 mg total) by mouth daily at 6 PM.    Dispense:  90 tablet    Refill:  3  . montelukast (SINGULAIR) 10 MG tablet    Sig: Take 1 tablet (10 mg total) by mouth at bedtime.    Dispense:  90 tablet    Refill:   3    Return in about 6 months (  around 10/19/2016) for recheck hypercholesterolemia.    Deema Juncaj Elayne Guerin, M.D. [facility] [address] [place], [place]  [address] [phone] phone [phone] fax

## 2016-05-04 ENCOUNTER — Ambulatory Visit (INDEPENDENT_AMBULATORY_CARE_PROVIDER_SITE_OTHER): Payer: PPO | Admitting: Family Medicine

## 2016-05-04 VITALS — BP 130/78 | HR 60 | Temp 98.0°F | Resp 16 | Ht 70.25 in | Wt 214.8 lb

## 2016-05-04 DIAGNOSIS — K122 Cellulitis and abscess of mouth: Secondary | ICD-10-CM | POA: Diagnosis not present

## 2016-05-04 DIAGNOSIS — K1379 Other lesions of oral mucosa: Secondary | ICD-10-CM

## 2016-05-04 DIAGNOSIS — T17308A Unspecified foreign body in larynx causing other injury, initial encounter: Secondary | ICD-10-CM | POA: Diagnosis not present

## 2016-05-04 LAB — POCT CBC
Granulocyte percent: 67.7 %G (ref 37–80)
HCT, POC: 43.4 % — AB (ref 43.5–53.7)
Hemoglobin: 15.2 g/dL (ref 14.1–18.1)
Lymph, poc: 1.5 (ref 0.6–3.4)
MCH, POC: 29.7 pg (ref 27–31.2)
MCHC: 34.9 g/dL (ref 31.8–35.4)
MCV: 85.1 fL (ref 80–97)
MID (cbc): 0.6 (ref 0–0.9)
MPV: 7 fL (ref 0–99.8)
POC Granulocyte: 4.3 (ref 2–6.9)
POC LYMPH PERCENT: 23.5 %L (ref 10–50)
POC MID %: 8.8 %M (ref 0–12)
Platelet Count, POC: 204 10*3/uL (ref 142–424)
RBC: 5.1 M/uL (ref 4.69–6.13)
RDW, POC: 13.4 %
WBC: 6.5 10*3/uL (ref 4.6–10.2)

## 2016-05-04 LAB — POCT RAPID STREP A (OFFICE): Rapid Strep A Screen: NEGATIVE

## 2016-05-04 MED ORDER — RANITIDINE HCL 150 MG PO TABS
150.0000 mg | ORAL_TABLET | Freq: Once | ORAL | Status: AC
  Administered 2016-05-04: 150 mg via ORAL

## 2016-05-04 MED ORDER — CETIRIZINE HCL 10 MG PO TABS
10.0000 mg | ORAL_TABLET | Freq: Once | ORAL | Status: AC
  Administered 2016-05-04: 10 mg via ORAL

## 2016-05-04 MED ORDER — AMOXICILLIN 500 MG PO TABS: 1000.0000 mg | ORAL_TABLET | Freq: Two times a day (BID) | ORAL | Status: DC

## 2016-05-04 NOTE — Patient Instructions (Addendum)
1.  Continue Zyrtec or Claritin daily. 2.  Continue Zantac 150mg  one tablet twice daily. 3. Start Amoxicillin 500mg   --- 2 tablets twice daily for ten days 4.  If swelling worsens, return immediately. 5.  If swelling does not improve, call for Prednisone prescription.   6.  Take Benadryl for persistent or worsening swelling.    IF you received an x-ray today, you will receive an invoice from [facility]. Please contact [facility] at [phone] with questions or concerns regarding your invoice.   IF you received labwork today, you will receive an invoice from [employer]. Please contact [facility] at [phone] with questions or concerns regarding your invoice.   Our billing staff will not be able to assist you with questions regarding bills from these companies.  You will be contacted with the lab results as soon as they are available. The fastest way to get your results is to activate your My Chart account. Instructions are located on the last page of this paperwork. If you have not heard from [place] regarding the results in 2 weeks, please contact this office.     Uvulitis Uvulitis is infection or inflammation of the uvula. The uvula is the small, finger-like piece of tissue that hangs down at the back of your throat. CAUSES This condition may be caused by:  An infection in the mouth or throat. This is the most common cause.  Trauma to the uvula. Causes of trauma include burning your mouth and heavy snoring.  Fluid build-up (edema). Edema can be triggered be an allergic reaction. Uvulitis that is caused by edema is called Quincke disease.  Inhaling irritants, such as chemical agents, smoke, or steam. SYMPTOMS Symptoms of this condition depend on the cause.  Symptoms of uvulitis that is caused by infection include:  Red, swollen uvula.  Sore throat.  Fever.  Headache.  Swollen neck glands. Symptoms of uvulitis that is caused by  trauma, edema, or irritation include:  Red, swollen uvula.  Sore throat.  Trouble swallowing.  Choking or gagging.  Trouble breathing. DIAGNOSIS This condition is diagnosed with a physical exam. You also may have tests, such as a throat culture and blood tests. TREATMENT Treatment for this condition depends on the cause. Treatment may involve:  Antibiotic medicine. Antibiotics may be prescribed if a bacterial infection is the cause.  Steroid medicine. Steroids may be given if edema is the cause.  Surgery to remove part of the uvula (partial uvulectomy). HOME CARE INSTRUCTIONS  Rest as much as possible until your condition improves.  Drink enough fluid to keep your urine clear or pale yellow.  Take over-the-counter and prescription medicines only as told by your health care provider.  If you were prescribed an antibiotic medicine, take it as told by your health care provider. Do not stop taking the antibiotic even if you start to feel better.  Use a cool-mist humidifier to ease irritation in your throat.  While your throat is sore:  Eat soft foods or drink liquids, such as soup.  Gargle with a salt-water mixture 3-4 times per day or as needed. To make a salt-water mixture, completely dissolve -1 tsp of salt in 1 cup of warm water.  Keep all follow-up visits as told by your health care provider. This is important. SEEK MEDICAL CARE IF:  You have a fever.  You have trouble eating.  Your symptoms do not get better.  Your symptoms come back after treatment. SEEK IMMEDIATE MEDICAL CARE IF:  You have trouble breathing.  You have trouble swallowing.   This information is not intended to replace advice given to you by your health care provider. Make sure you discuss any questions you have with your health care provider.   Document Released: 06/10/2004 Document Revised: 07/22/2015 Document Reviewed: 01/21/2015 Elsevier Interactive Patient Education [employer].

## 2016-05-04 NOTE — Progress Notes (Signed)
Subjective:    [name], male    DOB: Dec 01, 1945, 70 y.o.   MRN: [id number]  05/04/2016  Oral Swelling   HPI This 70 y.o. male presents urgently for evaluation of uvula swelling/choking.  Awoke this morning at 4:00am and felt uvula swollen and choking.  Wife looked with flashlight and appreciated swollen uvula.  Has a swollen sensation lower in airway.  No SOB or wheezing.  No fever/chills/sweats.  No headache.  No ear pain.  No rhinorrhea; no nasal congestion; no cough.  No DOE.  Went with walk with no problems.  Took normal medications this morning without abnormality.  Swallowing medicaiton this morning without difficulty.  As long as sitting upright, feels fine. If blows out air, to clear throat, can feel enlarged uvula.    With grandchildren last week; all immunized.   Ate pizza last night; at beach last week and suffered two insect bites on L medial ankle.  No rash on skin.  Area is doing well.  Did have L thigh pimples last week with resolution.     Review of Systems  Constitutional: Negative for fever, chills, diaphoresis, activity change, appetite change and fatigue.  HENT: Positive for trouble swallowing. Negative for congestion, dental problem, drooling, ear discharge, ear pain, mouth sores, nosebleeds, postnasal drip, rhinorrhea, sinus pressure, sneezing, sore throat and voice change.   Respiratory: Negative for cough, shortness of breath, wheezing and stridor.   Cardiovascular: Negative for chest pain, palpitations and leg swelling.  Gastrointestinal: Negative for nausea, vomiting, abdominal pain and diarrhea.  Endocrine: Negative for cold intolerance, heat intolerance, polydipsia, polyphagia and polyuria.  Skin: Negative for color change, rash and wound.  Neurological: Negative for dizziness, tremors, seizures, syncope, facial asymmetry, speech difficulty, weakness, light-headedness, numbness and headaches.  Hematological: Negative for adenopathy.    Psychiatric/Behavioral: Negative for sleep disturbance and dysphoric mood. The patient is not nervous/anxious.     Past Medical History  Diagnosis Date  . Allergy   . Hyperlipidemia   . Nasal polyps    Past Surgical History  Procedure Laterality Date  . Colonoscopy  04/09/13    Two polyps.  Outlaw/[facility].  Repeat 5 years.  . Nasal sinus surgery  11/15/2003    x 3.  [name].  . Nasal polyp surgery     Allergies  Allergen Reactions  . Latex Swelling    OF HANDS    Social History   Social History  . Marital Status: Single    Spouse Name: N/A  . Number of Children: 3  . Years of Education: N/A   Occupational History  . Retired    Social History Main Topics  . Smoking status: Never Smoker   . Smokeless tobacco: Never Used  . Alcohol Use: No  . Drug Use: No  . Sexual Activity:    Partners: Female    Patent examiner Protection: Post-menopausal   Other Topics Concern  . Not on file   Social History Narrative   Marital status: married x 45 years.      Children: 3 children (twin sons); 6 grandchildren.      Lives: with wife, father.      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      Tobacco: none      Alcohol: none currently; stopped several years ago.       Exercise: walking daily.  Seatbelt: 100%      ADLs: independent with all ADLs; drives; no assistant devices for ambulation.      Advance Directives: +LIVING WILL; FULL CODE; no prolonged measures.         Family History  Problem Relation Age of Onset  . Osteoporosis Mother   . Hyperlipidemia Father   . Hypertension Father   . Arthritis Father   . Depression Father   . COPD Sister   . COPD Maternal Grandfather        Objective:    BP 130/78 mmHg  Pulse 60  Temp(Src) 98 F (36.7 C) (Oral)  Resp 16  Ht 5' 10.25" (1.784 m)  Wt 214 lb 12.8 oz (97.433 kg)  BMI 30.61 kg/m2  SpO2 97% Physical Exam  Constitutional: He  is oriented to person, place, and time. [name]. No distress.  HENT:  Head: Normocephalic and atraumatic.  Right Ear: Tympanic membrane, external ear and ear canal normal.  Left Ear: Tympanic membrane, external ear and ear canal normal.  Nose: Nose normal. No mucosal edema or rhinorrhea.  Mouth/Throat: Oropharynx is clear and moist and mucous membranes are normal. No oral lesions. Normal dentition. Uvula swelling present. No dental abscesses or dental caries. No oropharyngeal exudate, posterior oropharyngeal edema, posterior oropharyngeal erythema or tonsillar abscesses.  Eyes: Conjunctivae and EOM are normal. Pupils are equal, round, and reactive to light.  Neck: Normal range of motion. Neck supple. Carotid bruit is not present. No thyromegaly present.  Cardiovascular: Normal rate, regular rhythm, normal heart sounds and intact distal pulses.  Exam reveals no gallop and no friction rub.   No murmur heard. Pulmonary/Chest: Effort normal and breath sounds normal. He has no wheezes. He has no rales.  Abdominal: Soft. Bowel sounds are normal.  Lymphadenopathy:    He has no cervical adenopathy.  Neurological: He is alert and oriented to person, place, and time. No cranial nerve deficit.  Skin: Skin is warm and dry. No rash noted. He is not diaphoretic.  Psychiatric: He has a normal mood and affect. His behavior is normal.  Nursing note and vitals reviewed.  Results for orders placed or performed in visit on 05/04/16  POCT rapid strep A  Result Value Ref Range   Rapid Strep A Screen Negative Negative  POCT CBC  Result Value Ref Range   WBC 6.5 4.6 - 10.2 K/uL   Lymph, poc 1.5 0.6 - 3.4   POC LYMPH PERCENT 23.5 10 - 50 %L   MID (cbc) 0.6 0 - 0.9   POC MID % 8.8 0 - 12 %M   POC Granulocyte 4.3 2 - 6.9   Granulocyte percent 67.7 37 - 80 %G   RBC 5.10 4.69 - 6.13 M/uL   Hemoglobin 15.2 14.1 - 18.1 g/dL   HCT, POC 43.4 (A) 43.5 - 53.7 %   MCV 85.1 80 -  97 fL   MCH, POC 29.7 27 - 31.2 pg   MCHC 34.9 31.8 - 35.4 g/dL   RDW, POC 13.4 %   Platelet Count, POC 204 142 - 424 K/uL   MPV 7.0 0 - 99.8 fL       Assessment & Plan:   1. Uvulitis   2. Uvular swelling   3. Choking, initial encounter    -brought back emergently. -New. -No respiratory distress; eating and drinking and speaking without difficulties. -ddx includes allergic reaction, viral etiology, bacterial etiology. -send throat culture. -treat empirically with Amoxicillin for bacterial etiology. -continue  Zyrtec or Claritin daily and Zantac bid. -if no improvement today, call for Prednisone. -To ED for acute worsening, respiratory distress.   Orders Placed This Encounter  Procedures  . Culture, Group A Strep    Order Specific Question:  Source    Answer:  oropharynx  . POCT rapid strep A  . POCT CBC   Meds ordered this encounter  Medications  . ranitidine (ZANTAC) tablet 150 mg    Sig:   . cetirizine (ZYRTEC) tablet 10 mg    Sig:   . amoxicillin (AMOXIL) 500 MG tablet    Sig: Take 2 tablets (1,000 mg total) by mouth 2 (two) times daily.    Dispense:  40 tablet    Refill:  0    Return if symptoms worsen or fail to improve.    Reia Viernes Elayne Guerin, M.D. [facility] [address] [place], [place]  [address] [phone] phone [phone] fax

## 2016-05-05 LAB — CULTURE, GROUP A STREP: Organism ID, Bacteria: NORMAL

## 2016-06-09 DIAGNOSIS — M1711 Unilateral primary osteoarthritis, right knee: Secondary | ICD-10-CM | POA: Diagnosis not present

## 2016-06-21 DIAGNOSIS — L820 Inflamed seborrheic keratosis: Secondary | ICD-10-CM | POA: Diagnosis not present

## 2016-08-01 ENCOUNTER — Ambulatory Visit (INDEPENDENT_AMBULATORY_CARE_PROVIDER_SITE_OTHER): Payer: PPO | Admitting: Urgent Care

## 2016-08-01 VITALS — BP 110/68 | HR 70 | Temp 98.7°F | Resp 18 | Ht 70.25 in | Wt 214.0 lb

## 2016-08-01 DIAGNOSIS — R490 Dysphonia: Secondary | ICD-10-CM

## 2016-08-01 DIAGNOSIS — R1310 Dysphagia, unspecified: Secondary | ICD-10-CM | POA: Diagnosis not present

## 2016-08-01 DIAGNOSIS — K122 Cellulitis and abscess of mouth: Secondary | ICD-10-CM | POA: Diagnosis not present

## 2016-08-01 MED ORDER — CEPHALEXIN 500 MG PO CAPS: 500.0000 mg | ORAL_CAPSULE | Freq: Three times a day (TID) | ORAL | 0 refills | Status: DC

## 2016-08-01 MED ORDER — METHYLPREDNISOLONE ACETATE 80 MG/ML IJ SUSP
80.0000 mg | Freq: Once | INTRAMUSCULAR | Status: AC
  Administered 2016-08-01: 80 mg via INTRAMUSCULAR

## 2016-08-01 NOTE — Progress Notes (Signed)
    MRN: [id number] DOB: July 31, 1946  Subjective:   Carl Henry is a 70 y.o. male presenting for chief complaint of Choking (when patient is sleeping)  Reports 1 day history of recurrence of choking, difficulty swallowing. Now has voice hoarseness. He has been seen before for this, started on amoxicillin empirically for uvulitis with resolution but reports that it came back yesterday. Has tried to lay in different positions but still has choking sensations since last night, has hoarseness. Has not tried any medications for his symptoms. Denies fever, sinus pain, sinus congestion, tooth pain, cough, chest pain. Denies feeling knot in his throat or having difficulty holding food down. Denies n/v, abdominal pain. Denies smoking cigarettes. Denies family history of cancer.   Carl Henry has a current medication list which includes the following prescription(s): aspirin, atorvastatin, montelukast, OVER THE COUNTER MEDICATION, and PRESCRIPTION MEDICATION. Also is allergic to latex.  Carl Henry  has a past medical history of Allergy; Hyperlipidemia; and Nasal polyps. Also  has a past surgical history that includes Colonoscopy (04/09/13); Nasal sinus surgery (11/15/2003); and Nasal polyp surgery.  Objective:   Vitals: BP 110/68 (BP Location: Right Arm, Patient Position: Sitting, Cuff Size: Small)   Pulse 70   Temp 98.7 F (37.1 C) (Oral)   Resp 18   Ht 5' 10.25" (1.784 m)   Wt 214 lb (97.1 kg)   SpO2 96%   BMI 30.49 kg/m   Physical Exam  Constitutional: He is oriented to person, place, and time. [name].  HENT:  TM's intact bilaterally but no effusions or erythema. Nasal turbinates pink and moist without sinus tenderness. Uvula has mild-moderate swelling with associated erythema.  Throat without oropharyngeal exudates, erythema or abscesses.  Eyes: No scleral icterus.  Neck: Normal range of motion. Neck supple.  Cardiovascular: Normal rate, regular rhythm and  intact distal pulses.  Exam reveals no gallop and no friction rub.   No murmur heard. Pulmonary/Chest: No respiratory distress. He has no wheezes. He has no rales.  Lymphadenopathy:    He has no cervical adenopathy.  Neurological: He is alert and oriented to person, place, and time.  Skin: Skin is warm and dry.   Assessment and Plan :   1. Uvulitis 2. Difficulty swallowing 3. Hoarseness - Will start patient on Keflex, IM Depomedrol today. Referral to ENT. Discussed warning symptoms with patient that would warrant emergent evaluation in ED. Patient verbalized understanding.  Jaynee Eagles, PA-C [facility] [phone] 08/01/2016 10:40 AM

## 2016-08-01 NOTE — Patient Instructions (Addendum)
Uvulitis Uvulitis is infection or inflammation of the uvula. The uvula is the small, finger-like piece of tissue that hangs down at the back of your throat. CAUSES This condition may be caused by:  An infection in the mouth or throat. This is the most common cause.  Trauma to the uvula. Causes of trauma include burning your mouth and heavy snoring.  Fluid build-up (edema). Edema can be triggered be an allergic reaction. Uvulitis that is caused by edema is called Quincke disease.  Inhaling irritants, such as chemical agents, smoke, or steam. SYMPTOMS Symptoms of this condition depend on the cause.  Symptoms of uvulitis that is caused by infection include:  Red, swollen uvula.  Sore throat.  Fever.  Headache.  Swollen neck glands. Symptoms of uvulitis that is caused by trauma, edema, or irritation include:  Red, swollen uvula.  Sore throat.  Trouble swallowing.  Choking or gagging.  Trouble breathing. DIAGNOSIS This condition is diagnosed with a physical exam. You also may have tests, such as a throat culture and blood tests. TREATMENT Treatment for this condition depends on the cause. Treatment may involve:  Antibiotic medicine. Antibiotics may be prescribed if a bacterial infection is the cause.  Steroid medicine. Steroids may be given if edema is the cause.  Surgery to remove part of the uvula (partial uvulectomy). HOME CARE INSTRUCTIONS  Rest as much as possible until your condition improves.  Drink enough fluid to keep your urine clear or pale yellow.  Take over-the-counter and prescription medicines only as told by your health care provider.  If you were prescribed an antibiotic medicine, take it as told by your health care provider. Do not stop taking the antibiotic even if you start to feel better.  Use a cool-mist humidifier to ease irritation in your throat.  While your throat is sore:  Eat soft foods or drink liquids, such as soup.  Gargle with a  salt-water mixture 3-4 times per day or as needed. To make a salt-water mixture, completely dissolve -1 tsp of salt in 1 cup of warm water.  Keep all follow-up visits as told by your health care provider. This is important. SEEK MEDICAL CARE IF:  You have a fever.  You have trouble eating.  Your symptoms do not get better.  Your symptoms come back after treatment. SEEK IMMEDIATE MEDICAL CARE IF:  You have trouble breathing.  You have trouble swallowing.   This information is not intended to replace advice given to you by your health care provider. Make sure you discuss any questions you have with your health care provider.   Document Released: 06/10/2004 Document Revised: 07/22/2015 Document Reviewed: 01/21/2015 Elsevier Interactive Patient Education 2016 [employer].     IF you received an x-ray today, you will receive an invoice from [facility]. Please contact [facility] at [phone] with questions or concerns regarding your invoice.   IF you received labwork today, you will receive an invoice from [employer]. Please contact [facility] at [phone] with questions or concerns regarding your invoice.   Our billing staff will not be able to assist you with questions regarding bills from these companies.  You will be contacted with the lab results as soon as they are available. The fastest way to get your results is to activate your My Chart account. Instructions are located on the last page of this paperwork. If you have not heard from [place] regarding the results in 2 weeks, please contact this office.

## 2016-08-02 NOTE — Progress Notes (Signed)
Please be sure patient gets in to see ENT as soon as possible.

## 2016-08-03 ENCOUNTER — Telehealth: Payer: Self-pay

## 2016-08-03 ENCOUNTER — Telehealth: Payer: Self-pay | Admitting: Urgent Care

## 2016-08-03 NOTE — Telephone Encounter (Signed)
Called patient to check on his progress. I requested he call back and let us know how he is doing. If he has no improvement then he should be referred to ENT ASAP. I will be out for a week, please forward the message to the PA Pool. Thank you!

## 2016-08-03 NOTE — Telephone Encounter (Signed)
Please call for clarification of message.  Is he better during the day and worse at night?

## 2016-08-03 NOTE — Telephone Encounter (Signed)
Attempted to call  Pt. No answer. Message left to return call

## 2016-08-03 NOTE — Telephone Encounter (Signed)
Patient request to leave message with Dr. Tamala Julian. Patient stated his neck was swollen. He was prescribed antibiotic. He was instructed to call his ENT doctor. Patient stated he did contact ENT doctor. Symptoms are gone. Patient stated he is waking up in the middle of the night choking. [phone].

## 2016-08-04 NOTE — Telephone Encounter (Signed)
Spoke with pt. States is feeling better no more choking episodes has an appointment with ENT on Friday.

## 2016-08-05 DIAGNOSIS — K219 Gastro-esophageal reflux disease without esophagitis: Secondary | ICD-10-CM | POA: Diagnosis not present

## 2016-08-05 DIAGNOSIS — K1379 Other lesions of oral mucosa: Secondary | ICD-10-CM | POA: Diagnosis not present

## 2016-09-14 ENCOUNTER — Ambulatory Visit (INDEPENDENT_AMBULATORY_CARE_PROVIDER_SITE_OTHER): Payer: PPO | Admitting: Family Medicine

## 2016-09-14 DIAGNOSIS — Z23 Encounter for immunization: Secondary | ICD-10-CM

## 2016-09-20 ENCOUNTER — Ambulatory Visit: Payer: PPO | Admitting: Family Medicine

## 2016-10-25 ENCOUNTER — Ambulatory Visit (INDEPENDENT_AMBULATORY_CARE_PROVIDER_SITE_OTHER): Payer: PPO | Admitting: Family Medicine

## 2016-10-25 ENCOUNTER — Encounter: Payer: Self-pay | Admitting: Family Medicine

## 2016-10-25 VITALS — BP 120/74 | HR 68 | Temp 97.9°F | Resp 18 | Ht 70.25 in | Wt 207.0 lb

## 2016-10-25 DIAGNOSIS — J301 Allergic rhinitis due to pollen: Secondary | ICD-10-CM | POA: Diagnosis not present

## 2016-10-25 DIAGNOSIS — K122 Cellulitis and abscess of mouth: Secondary | ICD-10-CM | POA: Diagnosis not present

## 2016-10-25 DIAGNOSIS — K219 Gastro-esophageal reflux disease without esophagitis: Secondary | ICD-10-CM | POA: Diagnosis not present

## 2016-10-25 DIAGNOSIS — E7800 Pure hypercholesterolemia, unspecified: Secondary | ICD-10-CM

## 2016-10-25 DIAGNOSIS — R800 Isolated proteinuria: Secondary | ICD-10-CM

## 2016-10-25 LAB — POCT URINALYSIS DIP (MANUAL ENTRY)
Bilirubin, UA: NEGATIVE
Blood, UA: NEGATIVE
Glucose, UA: NEGATIVE
Ketones, POC UA: NEGATIVE
Leukocytes, UA: NEGATIVE
Nitrite, UA: NEGATIVE
Protein Ur, POC: NEGATIVE
Spec Grav, UA: 1.01
Urobilinogen, UA: 0.2
pH, UA: 7

## 2016-10-25 NOTE — Patient Instructions (Addendum)
IF you received an x-ray today, you will receive an invoice from [facility]. Please contact [facility] at [phone] with questions or concerns regarding your invoice.   IF you received labwork today, you will receive an invoice from [employer]. Please contact [facility] at [phone] with questions or concerns regarding your invoice.   Our billing staff will not be able to assist you with questions regarding bills from these companies.  You will be contacted with the lab results as soon as they are available. The fastest way to get your results is to activate your My Chart account. Instructions are located on the last page of this paperwork. If you have not heard from [place] regarding the results in 2 weeks, please contact this office.     Fat and Cholesterol Restricted Diet Introduction Getting too much fat and cholesterol in your diet may cause health problems. Following this diet helps keep your fat and cholesterol at normal levels. This can keep you from getting sick. What types of fat should I choose?  Choose monosaturated and polyunsaturated fats. These are found in foods such as olive oil, canola oil, flaxseeds, walnuts, almonds, and seeds.  Eat more omega-3 fats. Good choices include salmon, mackerel, sardines, tuna, flaxseed oil, and ground flaxseeds.  Limit saturated fats. These are in animal products such as meats, butter, and cream. They can also be in plant products such as palm oil, palm kernel oil, and coconut oil.  Avoid foods with partially hydrogenated oils in them. These contain trans fats. Examples of foods that have trans fats are stick margarine, some tub margarines, cookies, crackers, and other baked goods. What general guidelines do I need to follow?  Check food labels. Look for the words "trans fat" and "saturated fat."  When preparing a meal:  Fill half of your plate with vegetables and green salads.  Fill  one fourth of your plate with whole grains. Look for the word "whole" as the first word in the ingredient list.  Fill one fourth of your plate with lean protein foods.  Eat more foods that have fiber, like apples, carrots, beans, peas, and barley.  Eat more home-cooked foods. Eat less at restaurants and buffets.  Limit or avoid alcohol.  Limit foods high in starch and sugar.  Limit fried foods.  Cook foods without frying them. Baking, boiling, grilling, and broiling are all great options.  Lose weight if you are overweight. Losing even a small amount of weight can help your overall health. It can also help prevent diseases such as diabetes and heart disease. What foods can I eat? Grains  Whole grains, such as whole wheat or whole grain breads, crackers, cereals, and pasta. Unsweetened oatmeal, bulgur, barley, quinoa, or brown rice. Corn or whole wheat flour tortillas. Vegetables  Fresh or frozen vegetables (raw, steamed, roasted, or grilled). Green salads. Fruits  All fresh, canned (in natural juice), or frozen fruits. Meat and Other Protein Products  Ground beef (85% or leaner), grass-fed beef, or beef trimmed of fat. Skinless chicken or [place]. Ground chicken or [place]. Pork trimmed of fat. All fish and seafood. Eggs. Dried beans, peas, or lentils. Unsalted nuts or seeds. Unsalted canned or dry beans. Dairy  Low-fat dairy products, such as skim or 1% milk, 2% or reduced-fat cheeses, low-fat ricotta or cottage cheese, or plain low-fat yogurt. Fats and Oils  Tub margarines without trans fats. Light or reduced-fat mayonnaise and salad dressings. Avocado. Olive, canola, sesame, or safflower  oils. Natural peanut or almond butter (choose ones without added sugar and oil). The items listed above may not be a complete list of recommended foods or beverages. Contact your dietitian for more options.  What foods are not recommended? Grains  White bread. White pasta. White rice. Cornbread.  Bagels, pastries, and croissants. Crackers that contain trans fat. Vegetables  White potatoes. Corn. Creamed or fried vegetables. Vegetables in a cheese sauce. Fruits  Dried fruits. Canned fruit in light or heavy syrup. Fruit juice. Meat and Other Protein Products  Fatty cuts of meat. Ribs, chicken wings, bacon, sausage, bologna, salami, chitterlings, fatback, hot dogs, bratwurst, and packaged luncheon meats. Liver and organ meats. Dairy  Whole or 2% milk, cream, half-and-half, and cream cheese. Whole milk cheeses. Whole-fat or sweetened yogurt. Full-fat cheeses. Nondairy creamers and whipped toppings. Processed cheese, cheese spreads, or cheese curds. Sweets and Desserts  Corn syrup, sugars, honey, and molasses. Candy. Jam and jelly. Syrup. Sweetened cereals. Cookies, pies, cakes, donuts, muffins, and ice cream. Fats and Oils  Butter, stick margarine, lard, shortening, ghee, or bacon fat. Coconut, palm kernel, or palm oils. Beverages  Alcohol. Sweetened drinks (such as sodas, lemonade, and fruit drinks or punches). The items listed above may not be a complete list of foods and beverages to avoid. Contact your dietitian for more information.  This information is not intended to replace advice given to you by your health care provider. Make sure you discuss any questions you have with your health care provider. Document Released: 05/01/2012 Document Revised: 07/07/2016 Document Reviewed: 01/30/2014  2017 Elsevier

## 2016-10-25 NOTE — Progress Notes (Signed)
Subjective:    [name], male    DOB: 1946/04/22, 70 y.o.   MRN: [id number]  10/25/2016  Follow-up (6 MONTH)   HPI This 70 y.o. male presents for six month follow-up for hypercholesterolemia, allergic rhinitis, proteinuria, uvulitis.  S/p ENT consultation with Wilburn Cornelia; placed on Omeprazole.  Doing well.  This week, suffered with PND and had to spit it out.   No noticeable reflux; just felt like choking.   No recurrence. Exercising at [facility] three days per week; dad refuses to go to [facility].  Sister-in-law fighting cancer; doing well.      Wt Readings from Last 3 Encounters:  10/25/16 207 lb (93.9 kg)  08/01/16 214 lb (97.1 kg)  05/04/16 214 lb 12.8 oz (97.4 kg)   BP Readings from Last 3 Encounters:  10/25/16 120/74  08/01/16 110/68  05/04/16 130/78   Immunization History  Administered Date(s) Administered  . Hepatitis A 03/22/2010, 03/02/2011  . Influenza,inj,Quad PF,36+ Mos 08/29/2013, 08/25/2014, 09/21/2015, 09/14/2016  . Pneumococcal Conjugate-13 08/25/2014  . Pneumococcal Polysaccharide-23 08/29/2013  . Td 08/14/2004  . Tdap 03/04/2008    Review of Systems  Constitutional: Negative for activity change, appetite change, chills, diaphoresis, fatigue and fever.  Eyes: Negative for visual disturbance.  Respiratory: Negative for cough and shortness of breath.   Cardiovascular: Negative for chest pain, palpitations and leg swelling.  Endocrine: Negative for cold intolerance, heat intolerance, polydipsia, polyphagia and polyuria.  Neurological: Negative for dizziness, tremors, seizures, syncope, facial asymmetry, speech difficulty, weakness, light-headedness, numbness and headaches.    Past Medical History:  Diagnosis Date  . Allergy   . Hyperlipidemia   . Nasal polyps    Past Surgical History:  Procedure Laterality Date  . COLONOSCOPY  04/09/13   Two polyps.  Outlaw/[facility].  Repeat 5 years.  [name] NASAL POLYP SURGERY    . NASAL SINUS SURGERY  11/15/2003    x 3.  [name].   Allergies  Allergen Reactions  . Latex Swelling    OF HANDS   Current Outpatient Prescriptions  Medication Sig Dispense Refill  . aspirin 81 MG tablet Take 81 mg by mouth daily.    [name] atorvastatin (LIPITOR) 20 MG tablet Take 1 tablet (20 mg total) by mouth daily at 6 PM. 90 tablet 3  . montelukast (SINGULAIR) 10 MG tablet Take 1 tablet (10 mg total) by mouth at bedtime. 90 tablet 3  . omeprazole (PRILOSEC) 40 MG capsule Take 40 mg by mouth daily.    [name] OVER THE COUNTER MEDICATION     . PRESCRIPTION MEDICATION Nasacort nasal spray taking prn     No current facility-administered medications for this visit.    Social History   Social History  . Marital status: Single    Spouse name: N/A  . Number of children: 3  . Years of education: N/A   Occupational History  . Retired Retired   Social History Main Topics  . Smoking status: Never Smoker  . Smokeless tobacco: Never Used  . Alcohol use No  . Drug use: No  . Sexual activity: Yes    Partners: Female    Birth control/ protection: Post-menopausal   Other Topics Concern  . Not on file   Social History Narrative   Marital status: married x 45 years.      Children: 3 children (twin sons); 6 grandchildren.      Lives: with wife, father.      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        Teacher x 5 years.      Tobacco: none      Alcohol: none currently; stopped several years ago.       Exercise: walking daily.         Seatbelt: 100%      ADLs: independent with all ADLs; drives; no assistant devices for ambulation.      Advance Directives: +LIVING WILL; FULL CODE; no prolonged measures.         Family History  Problem Relation Age of Onset  . Osteoporosis Mother   . Hyperlipidemia Father   . Hypertension Father   . Arthritis Father   . Depression Father   . COPD Sister   . COPD Maternal Grandfather        Objective:    BP 120/74 (BP Location:  Left Arm, Patient Position: Sitting, Cuff Size: Small)   Pulse 68   Temp 97.9 F (36.6 C) (Oral)   Resp 18   Ht 5' 10.25" (1.784 m)   Wt 207 lb (93.9 kg)   SpO2 97%   BMI 29.49 kg/m  Physical Exam  Constitutional: He is oriented to person, place, and time. [name]. No distress.  HENT:  Head: Normocephalic and atraumatic.  Right Ear: External ear normal.  Left Ear: External ear normal.  Nose: Nose normal.  Mouth/Throat: Oropharynx is clear and moist.  Eyes: Conjunctivae and EOM are normal. Pupils are equal, round, and reactive to light.  Neck: Normal range of motion. Neck supple. Carotid bruit is not present. No thyromegaly present.  Cardiovascular: Normal rate, regular rhythm, normal heart sounds and intact distal pulses.  Exam reveals no gallop and no friction rub.   No murmur heard. Pulmonary/Chest: Effort normal and breath sounds normal. He has no wheezes. He has no rales.  Abdominal: Soft. Bowel sounds are normal. He exhibits no distension and no mass. There is no tenderness. There is no rebound and no guarding.  Lymphadenopathy:    He has no cervical adenopathy.  Neurological: He is alert and oriented to person, place, and time. No cranial nerve deficit.  Skin: Skin is warm and dry. No rash noted. He is not diaphoretic.  Psychiatric: He has a normal mood and affect. His behavior is normal.  Nursing note and vitals reviewed.   Depression screen [facility] 2/9 10/25/2016 08/01/2016 05/04/2016 04/19/2016 09/21/2015  Decreased Interest 0 0 0 0 0  Down, Depressed, Hopeless 0 0 0 0 0  PHQ - 2 Score 0 0 0 0 0   Fall Risk  10/25/2016 08/01/2016 05/04/2016 04/19/2016 09/21/2015  Falls in the past year? No No No No No   Functional Status Survey: Is the patient deaf or have difficulty hearing?: No Does the patient have difficulty seeing, even when wearing glasses/contacts?: No Does the patient have difficulty concentrating, remembering, or making decisions?:  No Does the patient have difficulty walking or climbing stairs?: No Does the patient have difficulty dressing or bathing?: No Does the patient have difficulty doing errands alone such as visiting a doctor's office or shopping?: No     Assessment & Plan:   1. Pure hypercholesterolemia   2. 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 Uvulitis    -controlled hypercholesterolemia; continue current medications. -repeat u/a due to proteinuria at CPE. -continue current medications for allergic rhinitis. -follow-up with Wilburn Cornelia next month for uvulitis/LPR.  Orders Placed This Encounter  Procedures  . CBC with Differential/Platelet  . Comprehensive metabolic panel    Order Specific Question:   Has the patient fasted?    Answer:   Yes  . Lipid panel    Order Specific Question:   Has the patient fasted?    Answer:   Yes  . POCT urinalysis dipstick   Meds ordered this encounter  Medications  . omeprazole (PRILOSEC) 40 MG capsule    Sig: Take 40 mg by mouth daily.    Return in about 6 months (around 04/25/2017) for complete physical examiniation.   Oaklyn Jakubek Elayne Guerin, M.D. [facility] [address] [place],   [address] [phone] phone [phone] fax

## 2016-10-26 LAB — COMPREHENSIVE METABOLIC PANEL
ALT: 16 IU/L (ref 0–44)
AST: 16 IU/L (ref 0–40)
Albumin/Globulin Ratio: 1.8 (ref 1.2–2.2)
Albumin: 4.4 g/dL (ref 3.6–4.8)
Alkaline Phosphatase: 87 IU/L (ref 39–117)
BUN/Creatinine Ratio: 16 (ref 10–24)
BUN: 13 mg/dL (ref 8–27)
Bilirubin Total: 0.5 mg/dL (ref 0.0–1.2)
CO2: 23 mmol/L (ref 18–29)
Calcium: 9.7 mg/dL (ref 8.6–10.2)
Chloride: 103 mmol/L (ref 96–106)
Creatinine, Ser: 0.83 mg/dL (ref 0.76–1.27)
GFR calc Af Amer: 105 mL/min/{1.73_m2} (ref >59)
GFR calc non Af Amer: 90 mL/min/{1.73_m2} (ref >59)
Globulin, Total: 2.4 g/dL (ref 1.5–4.5)
Glucose: 85 mg/dL (ref 65–99)
Potassium: 4.8 mmol/L (ref 3.5–5.2)
Sodium: 143 mmol/L (ref 134–144)
Total Protein: 6.8 g/dL (ref 6.0–8.5)

## 2016-10-26 LAB — CBC WITH DIFFERENTIAL/PLATELET
Basophils Absolute: 0 10*3/uL (ref 0.0–0.2)
Basos: 0 %
EOS (ABSOLUTE): 0.1 10*3/uL (ref 0.0–0.4)
Eos: 2 %
Hematocrit: 43.7 % (ref 37.5–51.0)
Hemoglobin: 14.7 g/dL (ref 13.0–17.7)
Immature Grans (Abs): 0 10*3/uL (ref 0.0–0.1)
Immature Granulocytes: 0 %
Lymphocytes Absolute: 1.6 10*3/uL (ref 0.7–3.1)
Lymphs: 22 %
MCH: 28.9 pg (ref 26.6–33.0)
MCHC: 33.6 g/dL (ref 31.5–35.7)
MCV: 86 fL (ref 79–97)
Monocytes Absolute: 0.5 10*3/uL (ref 0.1–0.9)
Monocytes: 7 %
Neutrophils Absolute: 5 10*3/uL (ref 1.4–7.0)
Neutrophils: 69 %
Platelets: 263 10*3/uL (ref 150–379)
RBC: 5.09 x10E6/uL (ref 4.14–5.80)
RDW: 13.6 % (ref 12.3–15.4)
WBC: 7.3 10*3/uL (ref 3.4–10.8)

## 2016-10-26 LAB — LIPID PANEL
Chol/HDL Ratio: 4.5 ratio units (ref 0.0–5.0)
Cholesterol, Total: 195 mg/dL (ref 100–199)
HDL: 43 mg/dL (ref >39)
LDL Calculated: 125 mg/dL — ABNORMAL HIGH (ref 0–99)
Triglycerides: 137 mg/dL (ref 0–149)
VLDL Cholesterol Cal: 27 mg/dL (ref 5–40)

## 2016-10-31 DIAGNOSIS — H43811 Vitreous degeneration, right eye: Secondary | ICD-10-CM | POA: Diagnosis not present

## 2016-10-31 DIAGNOSIS — H2513 Age-related nuclear cataract, bilateral: Secondary | ICD-10-CM | POA: Diagnosis not present

## 2016-10-31 DIAGNOSIS — Z0100 Encounter for examination of eyes and vision without abnormal findings: Secondary | ICD-10-CM | POA: Diagnosis not present

## 2016-10-31 DIAGNOSIS — H25813 Combined forms of age-related cataract, bilateral: Secondary | ICD-10-CM | POA: Diagnosis not present

## 2016-10-31 DIAGNOSIS — H52203 Unspecified astigmatism, bilateral: Secondary | ICD-10-CM | POA: Diagnosis not present

## 2016-10-31 DIAGNOSIS — H04123 Dry eye syndrome of bilateral lacrimal glands: Secondary | ICD-10-CM | POA: Diagnosis not present

## 2016-11-14 DIAGNOSIS — K219 Gastro-esophageal reflux disease without esophagitis: Secondary | ICD-10-CM | POA: Insufficient documentation

## 2016-11-14 HISTORY — DX: Gastro-esophageal reflux disease without esophagitis: K21.9

## 2017-01-27 ENCOUNTER — Telehealth: Payer: Self-pay | Admitting: Family Medicine

## 2017-01-27 NOTE — Telephone Encounter (Signed)
MyChart message sent to patient about rescheduling their appt with Dr Tamala Julian on 04/25/17.

## 2017-03-07 ENCOUNTER — Ambulatory Visit (INDEPENDENT_AMBULATORY_CARE_PROVIDER_SITE_OTHER): Payer: PPO | Admitting: Family Medicine

## 2017-03-07 ENCOUNTER — Encounter: Payer: Self-pay | Admitting: Family Medicine

## 2017-03-07 VITALS — BP 114/65 | HR 50 | Temp 98.1°F | Resp 16 | Ht 70.0 in | Wt 212.2 lb

## 2017-03-07 DIAGNOSIS — Z6830 Body mass index (BMI) 30.0-30.9, adult: Secondary | ICD-10-CM | POA: Diagnosis not present

## 2017-03-07 DIAGNOSIS — E7800 Pure hypercholesterolemia, unspecified: Secondary | ICD-10-CM

## 2017-03-07 DIAGNOSIS — K219 Gastro-esophageal reflux disease without esophagitis: Secondary | ICD-10-CM | POA: Diagnosis not present

## 2017-03-07 DIAGNOSIS — Z125 Encounter for screening for malignant neoplasm of prostate: Secondary | ICD-10-CM | POA: Diagnosis not present

## 2017-03-07 DIAGNOSIS — Z0000 Encounter for general adult medical examination without abnormal findings: Secondary | ICD-10-CM | POA: Diagnosis not present

## 2017-03-07 DIAGNOSIS — J301 Allergic rhinitis due to pollen: Secondary | ICD-10-CM | POA: Diagnosis not present

## 2017-03-07 DIAGNOSIS — E6609 Other obesity due to excess calories: Secondary | ICD-10-CM

## 2017-03-07 LAB — POCT URINALYSIS DIP (MANUAL ENTRY)
Blood, UA: NEGATIVE
Glucose, UA: NEGATIVE mg/dL
Leukocytes, UA: NEGATIVE
Nitrite, UA: NEGATIVE
Protein Ur, POC: NEGATIVE mg/dL
Spec Grav, UA: 1.025 (ref 1.010–1.025)
Urobilinogen, UA: 0.2 E.U./dL
pH, UA: 5 (ref 5.0–8.0)

## 2017-03-07 MED ORDER — ATORVASTATIN CALCIUM 20 MG PO TABS: 20.0000 mg | ORAL_TABLET | Freq: Every day | ORAL | 3 refills | Status: DC

## 2017-03-07 MED ORDER — MONTELUKAST SODIUM 10 MG PO TABS: 10.0000 mg | ORAL_TABLET | Freq: Every day | ORAL | 3 refills | Status: DC

## 2017-03-07 NOTE — Patient Instructions (Addendum)
Preventive Care 65 Years and Older, Male Preventive care refers to lifestyle choices and visits with your health care provider that can promote health and wellness. What does preventive care include?  A yearly physical exam. This is also called an annual well check.  Dental exams once or twice a year.  Routine eye exams. Ask your health care provider how often you should have your eyes checked.  Personal lifestyle choices, including:  Daily care of your teeth and gums.  Regular physical activity.  Eating a healthy diet.  Avoiding tobacco and drug use.  Limiting alcohol use.  Practicing safe sex.  Taking low doses of aspirin every day.  Taking vitamin and mineral supplements as recommended by your health care provider. What happens during an annual well check? The services and screenings done by your health care provider during your annual well check will depend on your age, overall health, lifestyle risk factors, and family history of disease. Counseling  Your health care provider may ask you questions about your:  Alcohol use.  Tobacco use.  Drug use.  Emotional well-being.  Home and relationship well-being.  Sexual activity.  Eating habits.  History of falls.  Memory and ability to understand (cognition).  Work and work environment. Screening  You may have the following tests or measurements:  Height, weight, and BMI.  Blood pressure.  Lipid and cholesterol levels. These may be checked every 5 years, or more frequently if you are over 50 years old.  Skin check.  Lung cancer screening. You may have this screening every year starting at age 55 if you have a 30-pack-year history of smoking and currently smoke or have quit within the past 15 years.  Fecal occult blood test (FOBT) of the stool. You may have this test every year starting at age 50.  Flexible sigmoidoscopy or colonoscopy. You may have a sigmoidoscopy every 5 years or a colonoscopy every 10  years starting at age 50.  Prostate cancer screening. Recommendations will vary depending on your family history and other risks.  Hepatitis C blood test.  Hepatitis B blood test.  Sexually transmitted disease (STD) testing.  Diabetes screening. This is done by checking your blood sugar (glucose) after you have not eaten for a while (fasting). You may have this done every 1-3 years.  Abdominal aortic aneurysm (AAA) screening. You may need this if you are a current or former smoker.  Osteoporosis. You may be screened starting at age 70 if you are at high risk. Talk with your health care provider about your test results, treatment options, and if necessary, the need for more tests. Vaccines  Your health care provider may recommend certain vaccines, such as:  Influenza vaccine. This is recommended every year.  Tetanus, diphtheria, and acellular pertussis (Tdap, Td) vaccine. You may need a Td booster every 10 years.  Varicella vaccine. You may need this if you have not been vaccinated.  Zoster vaccine. You may need this after age 60.  Measles, mumps, and rubella (MMR) vaccine. You may need at least one dose of MMR if you were born in 1957 or later. You may also need a second dose.  Pneumococcal 13-valent conjugate (PCV13) vaccine. One dose is recommended after age 65.  Pneumococcal polysaccharide (PPSV23) vaccine. One dose is recommended after age 65.  Meningococcal vaccine. You may need this if you have certain conditions.  Hepatitis A vaccine. You may need this if you have certain conditions or if you travel or work in places where   you may be exposed to hepatitis A.  Hepatitis B vaccine. You may need this if you have certain conditions or if you travel or work in places where you may be exposed to hepatitis B.  Haemophilus influenzae type b (Hib) vaccine. You may need this if you have certain risk factors. Talk to your health care provider about which screenings and vaccines you  need and how often you need them. This information is not intended to replace advice given to you by your health care provider. Make sure you discuss any questions you have with your health care provider. Document Released: 11/27/2015 Document Revised: 07/20/2016 Document Reviewed: 09/01/2015 Elsevier Interactive Patient Education  2017 [employer].     IF you received an x-ray today, you will receive an invoice from [facility]. Please contact [facility] at [phone] with questions or concerns regarding your invoice.   IF you received labwork today, you will receive an invoice from [facility]. Please contact [facility] at [phone] with questions or concerns regarding your invoice.   Our billing staff will not be able to assist you with questions regarding bills from these companies.  You will be contacted with the lab results as soon as they are available. The fastest way to get your results is to activate your My Chart account. Instructions are located on the last page of this paperwork. If you have not heard from [place] regarding the results in 2 weeks, please contact this office.

## 2017-03-07 NOTE — Progress Notes (Signed)
Subjective:    [name], male    DOB: 06/25/1946, 71 y.o.   MRN: [id number]  03/07/2017  Annual Exam   HPI This 71 y.o. male presents for Annual Wellness Examination Subsequent and for follow-up of chronic medical conditions.  Last physical:  04-19-16 Colonoscopy:  2014 PSA:  04-19-2016 Eye exam:  2018; +glasses.   Dental exam: every six months.  Due again in May 2018.  Leaving for [place] for mission trip.    Recently established care with [facility] system for himself and for his father.  Signed up for respite benefits with aging. [facility]uled for [facility] Initial physical examination.  Caregiver for father; would like to have cameras at home to monitor via iphone. Father declining; doing well with walker.   [facility] will send staff to sit with father when pt is out of town.  Dorthula Rue has overnight care for [employer].  Immunization History  Administered Date(s) Administered  . Hepatitis A 03/22/2010, 03/02/2011  . Influenza,inj,Quad PF,36+ Mos 08/29/2013, 08/25/2014, 09/21/2015, 09/14/2016  . Pneumococcal Conjugate-13 08/25/2014  . Pneumococcal Polysaccharide-23 08/29/2013  . Td 08/14/2004  . Tdap 03/04/2008   BP Readings from Last 3 Encounters:  03/07/17 114/65  10/25/16 120/74  08/01/16 110/68   Wt Readings from Last 3 Encounters:  03/07/17 212 lb 3.2 oz (96.3 kg)  10/25/16 207 lb (93.9 kg)  08/01/16 214 lb (97.1 kg)    Review of Systems  Constitutional: Negative for activity change, appetite change, chills, diaphoresis, fatigue, fever and unexpected weight change.  HENT: Negative for congestion, dental problem, drooling, ear discharge, ear pain, facial swelling, hearing loss, mouth sores, nosebleeds, postnasal drip, rhinorrhea, sinus pressure, sneezing, sore throat, tinnitus, trouble swallowing and voice change.   Eyes: Negative for photophobia, pain, discharge, redness, itching and visual disturbance.  Respiratory: Negative for apnea, cough, choking, chest tightness,  shortness of breath, wheezing and stridor.   Cardiovascular: Negative for chest pain, palpitations and leg swelling.  Gastrointestinal: Negative for abdominal pain, blood in stool, constipation, diarrhea, nausea and vomiting.  Endocrine: Negative for cold intolerance, heat intolerance, polydipsia, polyphagia and polyuria.  Genitourinary: Negative for decreased urine volume, difficulty urinating, discharge, dysuria, enuresis, flank pain, frequency, genital sores, hematuria, penile pain, penile swelling, scrotal swelling, testicular pain and urgency.  Musculoskeletal: Negative for arthralgias, back pain, gait problem, joint swelling, myalgias, neck pain and neck stiffness.  Skin: Negative for color change, pallor, rash and wound.  Allergic/Immunologic: Negative for environmental allergies, food allergies and immunocompromised state.  Neurological: Negative for dizziness, tremors, seizures, syncope, facial asymmetry, speech difficulty, weakness, light-headedness, numbness and headaches.  Hematological: Negative for adenopathy. Does not bruise/bleed easily.  Psychiatric/Behavioral: Negative for agitation, behavioral problems, confusion, decreased concentration, dysphoric mood, hallucinations, self-injury, sleep disturbance and suicidal ideas. The patient is not nervous/anxious and is not hyperactive.     Past Medical History:  Diagnosis Date  . Allergy   . GERD (gastroesophageal reflux disease)   . Hyperlipidemia   . Nasal polyps    Past Surgical History:  Procedure Laterality Date  . COLONOSCOPY  04/09/13   Two polyps.  Outlaw/[facility].  Repeat 5 years.  [name] NASAL POLYP SURGERY    . NASAL SINUS SURGERY  11/15/2003   x 3.  [name].   Allergies  Allergen Reactions  . Latex Swelling    OF HANDS    Social History   Social History  . Marital status: Single    Spouse name: N/A  . Number of children: 3  .  Years of education: N/A   Occupational History  . Retired Retired    Set designer   Social History Main Topics  . Smoking status: Never Smoker  . Smokeless tobacco: Never Used  . Alcohol use No  . Drug use: No  . Sexual activity: Yes    Partners: Female    Birth control/ protection: Post-menopausal   Other Topics Concern  . Not on file   Social History Narrative   Marital status: married x 46 years.      Children: 3 children (twin sons); 6 grandchildren.      Lives: with wife, father.      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        Teacher x 5 years.      Tobacco: none      Alcohol: none currently; stopped several years ago.       Exercise: walking daily; goes to [employer] five days per week in 2018.         Seatbelt: 100%      ADLs: independent with all ADLs; drives; no assistant devices for ambulation.      Advance Directives: +LIVING WILL; FULL CODE; no prolonged measures.         Family History  Problem Relation Age of Onset  . Osteoporosis Mother   . Hyperlipidemia Father   . Hypertension Father   . Arthritis Father   . Depression Father   . COPD Sister   . COPD Maternal Grandfather        Objective:    BP 114/65   Pulse (!) 50   Temp 98.1 F (36.7 C) (Oral)   Resp 16   Ht 5\' 10"  (1.778 m)   Wt 212 lb 3.2 oz (96.3 kg)   SpO2 96%   BMI 30.45 kg/m  Physical Exam  Constitutional: He is oriented to person, place, and time. [name]. No distress.  HENT:  Head: Normocephalic and atraumatic.  Right Ear: External ear normal.  Left Ear: External ear normal.  Nose: Nose normal.  Mouth/Throat: Oropharynx is clear and moist.  Eyes: Conjunctivae and EOM are normal. Pupils are equal, round, and reactive to light.  Neck: Normal range of motion. Neck supple. Carotid bruit is not present. No thyromegaly present.  Cardiovascular: Normal rate, regular rhythm, normal heart sounds and intact distal pulses.  Exam reveals no gallop and no  friction rub.   No murmur heard. Pulmonary/Chest: Effort normal and breath sounds normal. He has no wheezes. He has no rales.  Abdominal: Soft. Bowel sounds are normal. He exhibits no distension and no mass. There is no tenderness. There is no rebound and no guarding. Hernia confirmed negative in the right inguinal area and confirmed negative in the left inguinal area.  Genitourinary: Rectum normal, prostate normal, testes normal and penis normal.  Musculoskeletal:       Right shoulder: Normal.       Left shoulder: Normal.       Cervical back: Normal.  Lymphadenopathy:    He has no cervical adenopathy.       Right: No inguinal adenopathy present.       Left: No inguinal adenopathy present.  Neurological: He is alert and oriented to person, place, and time. He has normal reflexes. No cranial nerve deficit. He exhibits normal muscle tone. Coordination normal.  Skin: Skin is warm and dry. No rash noted. He is not diaphoretic.  Psychiatric: He  has a normal mood and affect. His behavior is normal. Judgment and thought content normal.   Depression screen [facility] 2/9 03/07/2017 10/25/2016 08/01/2016 05/04/2016 04/19/2016  Decreased Interest 0 0 0 0 0  Down, Depressed, Hopeless 0 0 0 0 0  PHQ - 2 Score 0 0 0 0 0   Fall Risk  03/07/2017 10/25/2016 08/01/2016 05/04/2016 04/19/2016  Falls in the past year? No No No No No   Functional Status Survey: Is the patient deaf or have difficulty hearing?: No Does the patient have difficulty seeing, even when wearing glasses/contacts?: No Does the patient have difficulty concentrating, remembering, or making decisions?: No Does the patient have difficulty walking or climbing stairs?: No Does the patient have difficulty dressing or bathing?: No Does the patient have difficulty doing errands alone such as visiting a doctor's office or shopping?: No      Assessment & Plan:   1. Encounter for Medicare annual wellness exam   2. Seasonal allergic rhinitis due to pollen    3. Laryngopharyngeal reflux   4. Pure hypercholesterolemia   5. Screening for prostate cancer   6. Class 1 obesity due to excess calories with serious comorbidity and body mass index (BMI) of 30.0 to 30.9 in adult    -anticipatory guidance --- exercise, weight loss, ASA daily.   -no evidence of depression; independent with ADLs; low fall risk; discussed advanced directives and end of life issues. -obtain age appropriate screening labs and labs for chronic disease management.  -refills provided.   Orders Placed This Encounter  Procedures  . CBC with Differential/Platelet  . Comprehensive metabolic panel    Order Specific Question:   Has the patient fasted?    Answer:   Yes  . Lipid panel    Order Specific Question:   Has the patient fasted?    Answer:   Yes  . PSA  . POCT urinalysis dipstick   Meds ordered this encounter  Medications  . vitamin E 100 UNIT capsule    Sig: Take by mouth daily.  [name] DISCONTD: atorvastatin (LIPITOR) 20 MG tablet    Sig: Take 1 tablet (20 mg total) by mouth daily at 6 PM.    Dispense:  90 tablet    Refill:  3  . DISCONTD: montelukast (SINGULAIR) 10 MG tablet    Sig: Take 1 tablet (10 mg total) by mouth at bedtime.    Dispense:  90 tablet    Refill:  3    Return in about 6 months (around 09/06/2017) for recheck cholesterol.   Mikolaj Woolstenhulme Elayne Guerin, M.D. [facility] [address] [place], [place]  [address] [phone] phone [phone] fax

## 2017-03-08 LAB — COMPREHENSIVE METABOLIC PANEL
ALT: 22 IU/L (ref 0–44)
AST: 25 IU/L (ref 0–40)
Albumin/Globulin Ratio: 2.3 — ABNORMAL HIGH (ref 1.2–2.2)
Albumin: 4.5 g/dL (ref 3.6–4.8)
Alkaline Phosphatase: 81 IU/L (ref 39–117)
BUN/Creatinine Ratio: 18 (ref 10–24)
BUN: 17 mg/dL (ref 8–27)
Bilirubin Total: 0.8 mg/dL (ref 0.0–1.2)
CO2: 25 mmol/L (ref 18–29)
Calcium: 9.4 mg/dL (ref 8.6–10.2)
Chloride: 101 mmol/L (ref 96–106)
Creatinine, Ser: 0.94 mg/dL (ref 0.76–1.27)
GFR calc Af Amer: 95 mL/min/{1.73_m2} (ref >59)
GFR calc non Af Amer: 82 mL/min/{1.73_m2} (ref >59)
Globulin, Total: 2 g/dL (ref 1.5–4.5)
Glucose: 82 mg/dL (ref 65–99)
Potassium: 4.6 mmol/L (ref 3.5–5.2)
Sodium: 142 mmol/L (ref 134–144)
Total Protein: 6.5 g/dL (ref 6.0–8.5)

## 2017-03-08 LAB — CBC WITH DIFFERENTIAL/PLATELET
Basophils Absolute: 0 10*3/uL (ref 0.0–0.2)
Basos: 0 %
EOS (ABSOLUTE): 0.1 10*3/uL (ref 0.0–0.4)
Eos: 2 %
Hematocrit: 43.5 % (ref 37.5–51.0)
Hemoglobin: 14.5 g/dL (ref 13.0–17.7)
Immature Grans (Abs): 0 10*3/uL (ref 0.0–0.1)
Immature Granulocytes: 0 %
Lymphocytes Absolute: 1.7 10*3/uL (ref 0.7–3.1)
Lymphs: 24 %
MCH: 28.1 pg (ref 26.6–33.0)
MCHC: 33.3 g/dL (ref 31.5–35.7)
MCV: 84 fL (ref 79–97)
Monocytes Absolute: 0.7 10*3/uL (ref 0.1–0.9)
Monocytes: 10 %
Neutrophils Absolute: 4.4 10*3/uL (ref 1.4–7.0)
Neutrophils: 64 %
Platelets: 251 10*3/uL (ref 150–379)
RBC: 5.16 x10E6/uL (ref 4.14–5.80)
RDW: 15.1 % (ref 12.3–15.4)
WBC: 6.9 10*3/uL (ref 3.4–10.8)

## 2017-03-08 LAB — LIPID PANEL
Chol/HDL Ratio: 4.6 ratio (ref 0.0–5.0)
Cholesterol, Total: 193 mg/dL (ref 100–199)
HDL: 42 mg/dL (ref >39)
LDL Calculated: 131 mg/dL — ABNORMAL HIGH (ref 0–99)
Triglycerides: 100 mg/dL (ref 0–149)
VLDL Cholesterol Cal: 20 mg/dL (ref 5–40)

## 2017-03-08 LAB — PSA: Prostate Specific Ag, Serum: 1.9 ng/mL (ref 0.0–4.0)

## 2017-03-09 ENCOUNTER — Other Ambulatory Visit: Payer: Self-pay

## 2017-03-09 MED ORDER — MONTELUKAST SODIUM 10 MG PO TABS: 10.0000 mg | ORAL_TABLET | Freq: Every day | ORAL | 3 refills | Status: DC

## 2017-03-09 MED ORDER — ATORVASTATIN CALCIUM 20 MG PO TABS: 20.0000 mg | ORAL_TABLET | Freq: Every day | ORAL | 3 refills | Status: DC

## 2017-03-09 MED ORDER — OMEPRAZOLE 40 MG PO CPDR: 40.0000 mg | DELAYED_RELEASE_CAPSULE | Freq: Every day | ORAL | 0 refills | Status: DC

## 2017-03-09 NOTE — Telephone Encounter (Signed)
Fax notice sent from [facility] service They no longer provide for his benefit plan.  Atorvastatin, Montelukast - need to change pharmacy  IC pt - LMOVM to return call and advise which pharmacy to use - Form in nurses box up front

## 2017-04-26 ENCOUNTER — Encounter: Payer: PPO | Admitting: Family Medicine

## 2017-05-29 DIAGNOSIS — I8312 Varicose veins of left lower extremity with inflammation: Secondary | ICD-10-CM | POA: Diagnosis not present

## 2017-05-29 DIAGNOSIS — D1801 Hemangioma of skin and subcutaneous tissue: Secondary | ICD-10-CM | POA: Diagnosis not present

## 2017-05-29 DIAGNOSIS — L821 Other seborrheic keratosis: Secondary | ICD-10-CM | POA: Diagnosis not present

## 2017-05-29 DIAGNOSIS — D225 Melanocytic nevi of trunk: Secondary | ICD-10-CM | POA: Diagnosis not present

## 2017-05-29 DIAGNOSIS — I8311 Varicose veins of right lower extremity with inflammation: Secondary | ICD-10-CM | POA: Diagnosis not present

## 2017-05-29 DIAGNOSIS — I872 Venous insufficiency (chronic) (peripheral): Secondary | ICD-10-CM | POA: Diagnosis not present

## 2017-07-06 DIAGNOSIS — M1812 Unilateral primary osteoarthritis of first carpometacarpal joint, left hand: Secondary | ICD-10-CM | POA: Diagnosis not present

## 2017-08-21 ENCOUNTER — Encounter: Payer: Self-pay | Admitting: Family Medicine

## 2017-09-04 DIAGNOSIS — H43811 Vitreous degeneration, right eye: Secondary | ICD-10-CM | POA: Diagnosis not present

## 2017-09-04 DIAGNOSIS — H524 Presbyopia: Secondary | ICD-10-CM | POA: Diagnosis not present

## 2017-09-04 DIAGNOSIS — H25813 Combined forms of age-related cataract, bilateral: Secondary | ICD-10-CM | POA: Diagnosis not present

## 2017-09-04 DIAGNOSIS — H04123 Dry eye syndrome of bilateral lacrimal glands: Secondary | ICD-10-CM | POA: Diagnosis not present

## 2017-09-13 ENCOUNTER — Ambulatory Visit: Payer: PPO | Admitting: Family Medicine

## 2017-09-18 ENCOUNTER — Encounter: Payer: Self-pay | Admitting: Family Medicine

## 2017-09-18 ENCOUNTER — Ambulatory Visit: Payer: PPO | Admitting: Family Medicine

## 2017-09-18 VITALS — BP 122/72 | HR 64 | Temp 97.7°F | Resp 16 | Ht 71.26 in | Wt 207.0 lb

## 2017-09-18 DIAGNOSIS — Z0000 Encounter for general adult medical examination without abnormal findings: Secondary | ICD-10-CM

## 2017-09-18 DIAGNOSIS — E7800 Pure hypercholesterolemia, unspecified: Secondary | ICD-10-CM

## 2017-09-18 DIAGNOSIS — J301 Allergic rhinitis due to pollen: Secondary | ICD-10-CM | POA: Diagnosis not present

## 2017-09-18 DIAGNOSIS — K219 Gastro-esophageal reflux disease without esophagitis: Secondary | ICD-10-CM | POA: Diagnosis not present

## 2017-09-18 NOTE — Patient Instructions (Addendum)
   IF you received an x-ray today, you will receive an invoice from [facility]. Please contact [facility] at [phone] with questions or concerns regarding your invoice.   IF you received labwork today, you will receive an invoice from [facility]. Please contact [facility] at [phone] with questions or concerns regarding your invoice.   Our billing staff will not be able to assist you with questions regarding bills from these companies.  You will be contacted with the lab results as soon as they are available. The fastest way to get your results is to activate your My Chart account. Instructions are located on the last page of this paperwork. If you have not heard from us regarding the results in 2 weeks, please contact this office.      Dyslipidemia Dyslipidemia is an imbalance of waxy, fat-like substances (lipids) in the blood. The body needs lipids in small amounts. Dyslipidemia often involves a high level of cholesterol or triglycerides, which are types of lipids. Common forms of dyslipidemia include:  High levels of bad cholesterol (LDL cholesterol). LDL is the type of cholesterol that causes fatty deposits (plaques) to build up in the blood vessels that carry blood away from your heart (arteries).  Low levels of good cholesterol (HDL cholesterol). HDL cholesterol is the type of cholesterol that protects against heart disease. High levels of HDL remove the LDL buildup from arteries.  High levels of triglycerides. Triglycerides are a fatty substance in the blood that is linked to a buildup of plaques in the arteries.  You can develop dyslipidemia because of the genes you are born with (primary dyslipidemia) or changes that occur during your life (secondary dyslipidemia), or as a side effect of certain medical treatments. What are the causes? Primary dyslipidemia is caused by changes (mutations) in genes that are passed down through families (inherited). These  mutations cause several types of dyslipidemia. Mutations can result in disorders that make the body produce too much LDL cholesterol or triglycerides, or not enough HDL cholesterol. These disorders may lead to heart disease, arterial disease, or stroke at an early age. Causes of secondary dyslipidemia include certain lifestyle choices and diseases that lead to dyslipidemia, such as:  Eating a diet that is high in animal fat.  Not getting enough activity or exercise (having a sedentary lifestyle).  Having diabetes, kidney disease, liver disease, or thyroid disease.  Drinking large amounts of alcohol.  Using certain types of drugs.  What increases the risk? You may be at greater risk for dyslipidemia if you are an older man or if you are a woman who has gone through menopause. Other risk factors include:  Having a family history of dyslipidemia.  Taking certain medicines, including birth control pills, steroids, some diuretics, beta-blockers, and some medicines forHIV.  Smoking cigarettes.  Eating a high-fat diet.  Drinking large amounts of alcohol.  Having certain medical conditions such as diabetes, polycystic ovary syndrome (PCOS), pregnancy, kidney disease, liver disease, or hypothyroidism.  Not exercising regularly.  Being overweight or obese with too much belly fat.  What are the signs or symptoms? Dyslipidemia does not usually cause any symptoms. Very high lipid levels can cause fatty bumps under the skin (xanthomas) or a white or gray ring around the black center (pupil) of the eye. Very high triglyceride levels can cause inflammation of the pancreas (pancreatitis). How is this diagnosed? Your health care provider may diagnose dyslipidemia based on a routine blood test (fasting blood test). Because most people do not   have symptoms of the condition, this blood testing (lipid profile) is done on adults age 20 and older and is repeated every 5 years. This test checks:  Total  cholesterol. This is a measure of the total amount of cholesterol in your blood, including LDL cholesterol, HDL cholesterol, and triglycerides. A healthy number is below 200.  LDL cholesterol. The target number for LDL cholesterol is different for each person, depending on individual risk factors. For most people, a number below 100 is healthy. Ask your health care provider what your LDL cholesterol number should be.  HDL cholesterol. An HDL level of 60 or higher is best because it helps to protect against heart disease. A number below 40 for men or below 50 for women increases the risk for heart disease.  Triglycerides. A healthy triglyceride number is below 150.  If your lipid profile is abnormal, your health care provider may do other blood tests to get more information about your condition. How is this treated? Treatment depends on the type of dyslipidemia that you have and your other risk factors for heart disease and stroke. Your health care provider will have a target range for your lipid levels based on this information. For many people, treatment starts with lifestyle changes, such as diet and exercise. Your health care provider may recommend that you:  Get regular exercise.  Make changes to your diet.  Quit smoking if you smoke.  If diet changes and exercise do not help you reach your goals, your health care provider may also prescribe medicine to lower lipids. The most commonly prescribed type of medicine lowers your LDL cholesterol (statin drug). If you have a high triglyceride level, your provider may prescribe another type of drug (fibrate) or an omega-3 fish oil supplement, or both. Follow these instructions at home:  Take over-the-counter and prescription medicines only as told by your health care provider. This includes supplements.  Get regular exercise. Start an aerobic exercise and strength training program as told by your health care provider. Ask your health care  provider what activities are safe for you. Your health care provider may recommend: ? 30 minutes of aerobic activity 4-6 days a week. Brisk walking is an example of aerobic activity. ? Strength training 2 days a week.  Eat a healthy diet as told by your health care provider. This can help you reach and maintain a healthy weight, lower your LDL cholesterol, and raise your HDL cholesterol. It may help to work with a diet and nutrition specialist (dietitian) to make a plan that is right for you. Your dietitian or health care provider may recommend: ? Limiting your calories, if you are overweight. ? Eating more fruits, vegetables, whole grains, fish, and lean meats. ? Limiting saturated fat, trans fat, and cholesterol.  Follow instructions from your health care provider or dietitian about eating or drinking restrictions.  Limit alcohol intake to no more than one drink per day for nonpregnant women and two drinks per day for men. One drink equals 12 oz of beer, 5 oz of wine, or 1 oz of hard liquor.  Do not use any products that contain nicotine or tobacco, such as cigarettes and e-cigarettes. If you need help quitting, ask your health care provider.  Keep all follow-up visits as told by your health care provider. This is important. Contact a health care provider if:  You are having trouble sticking to your exercise or diet plan.  You are struggling to quit smoking or control your use   of alcohol. Summary  Dyslipidemia is an imbalance of waxy, fat-like substances (lipids) in the blood. The body needs lipids in small amounts. Dyslipidemia often involves a high level of cholesterol or triglycerides, which are types of lipids.  Treatment depends on the type of dyslipidemia that you have and your other risk factors for heart disease and stroke.  For many people, treatment starts with lifestyle changes, such as diet and exercise. Your health care provider may also prescribe medicine to lower  lipids. This information is not intended to replace advice given to you by your health care provider. Make sure you discuss any questions you have with your health care provider. Document Released: 11/05/2013 Document Revised: 06/27/2016 Document Reviewed: 06/27/2016 Elsevier Interactive Patient Education  2018 Elsevier Inc.  

## 2017-09-18 NOTE — Progress Notes (Signed)
Subjective:    [name], male    DOB: 11/29/1945, 71 y.o.   MRN: [id number]  09/18/2017  Hyperlipidemia (6 month follow-up)    HPI This 71 y.o. male presents for Complete Physical Examination and six month evaluation of hypercholesterolemia, allergic rhinitis, and GERD.    [facility] there are two nurses who are starting an in-home service.  Did call and did an assessment on the house.  The RN who is running the program.  Dropped off a lot of brochures.   Misstepped on a ladder in September; hit L anterior shin.     Wanted provider to be aware.  Was cleaning gutters.    Exercising three days per week. Silver Sneakers.  Walking also one mile.    Plans to get shingrix at [facility].   BP Readings from Last 3 Encounters:  09/18/17 122/72  03/07/17 114/65  10/25/16 120/74   Wt Readings from Last 3 Encounters:  09/18/17 207 lb (93.9 kg)  03/07/17 212 lb 3.2 oz (96.3 kg)  10/25/16 207 lb (93.9 kg)   Immunization History  Administered Date(s) Administered  . Hepatitis A 03/22/2010, 03/02/2011  . Influenza,inj,Quad PF,6+ Mos 08/29/2013, 08/25/2014, 09/21/2015, 09/14/2016  . Influenza-Unspecified 08/28/2017  . Pneumococcal Conjugate-13 08/25/2014  . Pneumococcal Polysaccharide-23 08/29/2013  . Td 08/14/2004  . Tdap 03/04/2008    Review of Systems  Constitutional: Negative for activity change, appetite change, chills, diaphoresis, fatigue, fever and unexpected weight change.  HENT: Negative for congestion, dental problem, drooling, ear discharge, ear pain, facial swelling, hearing loss, mouth sores, nosebleeds, postnasal drip, rhinorrhea, sinus pressure, sneezing, sore throat, tinnitus, trouble swallowing and voice change.   Eyes: Negative for photophobia, pain, discharge, redness, itching and visual disturbance.  Respiratory: Negative for apnea, cough, choking, chest tightness, shortness of breath, wheezing and stridor.   Cardiovascular: Negative for chest pain,  palpitations and leg swelling.  Gastrointestinal: Negative for abdominal pain, blood in stool, constipation, diarrhea, nausea and vomiting.  Endocrine: Negative for cold intolerance, heat intolerance, polydipsia, polyphagia and polyuria.  Genitourinary: Negative for decreased urine volume, difficulty urinating, discharge, dysuria, enuresis, flank pain, frequency, genital sores, hematuria, penile pain, penile swelling, scrotal swelling, testicular pain and urgency.  Musculoskeletal: Negative for arthralgias, back pain, gait problem, joint swelling, myalgias, neck pain and neck stiffness.  Skin: Negative for color change, pallor, rash and wound.  Allergic/Immunologic: Negative for environmental allergies, food allergies and immunocompromised state.  Neurological: Negative for dizziness, tremors, seizures, syncope, facial asymmetry, speech difficulty, weakness, light-headedness, numbness and headaches.  Hematological: Negative for adenopathy. Does not bruise/bleed easily.  Psychiatric/Behavioral: Negative for agitation, behavioral problems, confusion, decreased concentration, dysphoric mood, hallucinations, self-injury, sleep disturbance and suicidal ideas. The patient is not nervous/anxious and is not hyperactive.     Past Medical History:  Diagnosis Date  . Allergy   . GERD (gastroesophageal reflux disease)   . Hyperlipidemia   . Nasal polyps    Past Surgical History:  Procedure Laterality Date  . COLONOSCOPY  04/09/13   Two polyps.  Outlaw/[facility].  Repeat 5 years.  [name] NASAL POLYP SURGERY    . NASAL SINUS SURGERY  11/15/2003   x 3.  [name].   Allergies  Allergen Reactions  . Latex Swelling    OF HANDS   Current Outpatient Medications on File Prior to Visit  Medication Sig Dispense Refill  . aspirin 81 MG tablet Take 81 mg by mouth daily.    [name] atorvastatin (LIPITOR) 20 MG tablet Take 1  tablet (20 mg total) by mouth daily at 6 PM. 90 tablet 3  . montelukast (SINGULAIR) 10 MG tablet Take  1 tablet (10 mg total) by mouth at bedtime. 90 tablet 3  . OVER THE COUNTER MEDICATION     . PRESCRIPTION MEDICATION Nasacort nasal spray taking prn    . vitamin E 100 UNIT capsule Take by mouth daily.     No current facility-administered medications on file prior to visit.    Social History   Socioeconomic History  . Marital status: Single    Spouse name: Not on file  . Number of children: 3  . Years of education: Not on file  . Highest education level: Not on file  Social Needs  . Financial resource strain: Not on file  . Food insecurity - worry: Not on file  . Food insecurity - inability: Not on file  . Transportation needs - medical: Not on file  . Transportation needs - non-medical: Not on file  Occupational History  . Occupation: Retired    Fish farm manager: RETIRED    Comment: Warden/ranger  Tobacco Use  . Smoking status: Never Smoker  . Smokeless tobacco: Never Used  Substance and Sexual Activity  . Alcohol use: No    Alcohol/week: 0.0 oz  . Drug use: No  . Sexual activity: Yes    Partners: Female    Birth control/protection: Post-menopausal  Other Topics Concern  . Not on file  Social History Narrative   Marital status: married x 46 years.      Children: 3 children (twin sons); 6 grandchildren.      Lives: with wife, father.      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        Teacher x 5 years.      Tobacco: none      Alcohol: none currently; stopped several years ago.       Exercise: walking daily; goes to [employer] five days per week in 2018.         Seatbelt: 100%      ADLs: independent with all ADLs; drives; no assistant devices for ambulation.      Advance Directives: +LIVING WILL; FULL CODE; no prolonged measures.         Family History  Problem Relation Age of Onset  . Osteoporosis Mother   . Hyperlipidemia Father   . Hypertension Father   . Arthritis Father   . Depression Father    . COPD Sister   . COPD Maternal Grandfather        Objective:    BP 122/72   Pulse 64   Temp 97.7 F (36.5 C) (Oral)   Resp 16   Ht 5' 11.26" (1.81 m)   Wt 207 lb (93.9 kg)   SpO2 98%   BMI 28.66 kg/m  Physical Exam  Constitutional: He is oriented to person, place, and time. [name]. No distress.  HENT:  Head: Normocephalic and atraumatic.  Right Ear: External ear normal.  Left Ear: External ear normal.  Nose: Nose normal.  Mouth/Throat: Oropharynx is clear and moist.  Eyes: Conjunctivae and EOM are normal. Pupils are equal, round, and reactive to light.  Neck: Normal range of motion. Neck supple. Carotid bruit is not present. No thyromegaly present.  Cardiovascular: Normal rate, regular rhythm, normal heart sounds and intact distal pulses. Exam reveals no gallop and no friction rub.  No murmur heard. Pulmonary/Chest: Effort normal  and breath sounds normal. He has no wheezes. He has no rales.  Abdominal: Soft. Bowel sounds are normal. He exhibits no distension and no mass. There is no tenderness. There is no rebound and no guarding.  Musculoskeletal:       Right shoulder: Normal.       Left shoulder: Normal.       Cervical back: Normal.  Lymphadenopathy:    He has no cervical adenopathy.  Neurological: He is alert and oriented to person, place, and time. He has normal reflexes. No cranial nerve deficit. He exhibits normal muscle tone. Coordination normal.  Skin: Skin is warm and dry. No rash noted. He is not diaphoretic.  Psychiatric: He has a normal mood and affect. His behavior is normal. Judgment and thought content normal.   No results found. Depression screen [facility] 2/9 09/18/2017 03/07/2017 10/25/2016 08/01/2016 05/04/2016  Decreased Interest 0 0 0 0 0  Down, Depressed, Hopeless 0 0 0 0 0  PHQ - 2 Score 0 0 0 0 0   Fall Risk  09/18/2017 03/07/2017 10/25/2016 08/01/2016 05/04/2016  Falls in the past year? No No No No No    Functional  Status Survey: Is the patient deaf or have difficulty hearing?: Yes Does the patient have difficulty seeing, even when wearing glasses/contacts?: No Does the patient have difficulty concentrating, remembering, or making decisions?: No Does the patient have difficulty walking or climbing stairs?: No Does the patient have difficulty dressing or bathing?: No Does the patient have difficulty doing errands alone such as visiting a doctor's office or shopping?: No     Assessment & Plan:   1. Routine physical examination   2. Seasonal allergic rhinitis due to pollen   3. Laryngopharyngeal reflux   4. Pure hypercholesterolemia     -anticipatory guidance provided --- exercise, weight loss, safe driving practices, aspirin 81mg  daily. -obtain age appropriate screening labs and labs for chronic disease management.  I recommend weight loss, exercise, and low-cholesterol low-fat food choices.  I recommend limiting red meat to once per week; I recommend limiting fried foods to once per month. [facility]; will return to office in one year for CPE with PCP.   Orders Placed This Encounter  Procedures  . CBC with Differential/Platelet  . Comprehensive metabolic panel    Order Specific Question:   Has the patient fasted?    Answer:   No  . Lipid panel    Order Specific Question:   Has the patient fasted?    Answer:   No   No orders of the defined types were placed in this encounter.   Return in about 1 year (around 09/18/2018) for complete physical examiniation.   Tonio Seider Elayne Guerin, M.D. Primary [facility] at [facility] [place] [address] [place], [place]  [address] [phone] phone [phone] fax

## 2017-09-19 LAB — LIPID PANEL
Chol/HDL Ratio: 4.5 ratio (ref 0.0–5.0)
Cholesterol, Total: 194 mg/dL (ref 100–199)
HDL: 43 mg/dL (ref >39)
LDL Calculated: 132 mg/dL — ABNORMAL HIGH (ref 0–99)
Triglycerides: 93 mg/dL (ref 0–149)
VLDL Cholesterol Cal: 19 mg/dL (ref 5–40)

## 2017-09-19 LAB — COMPREHENSIVE METABOLIC PANEL
ALT: 15 IU/L (ref 0–44)
AST: 18 IU/L (ref 0–40)
Albumin/Globulin Ratio: 1.9 (ref 1.2–2.2)
Albumin: 4.5 g/dL (ref 3.6–4.8)
Alkaline Phosphatase: 90 IU/L (ref 39–117)
BUN/Creatinine Ratio: 15 (ref 10–24)
BUN: 15 mg/dL (ref 8–27)
Bilirubin Total: 0.5 mg/dL (ref 0.0–1.2)
CO2: 23 mmol/L (ref 20–29)
Calcium: 9.6 mg/dL (ref 8.6–10.2)
Chloride: 106 mmol/L (ref 96–106)
Creatinine, Ser: 1.03 mg/dL (ref 0.76–1.27)
GFR calc Af Amer: 85 mL/min/{1.73_m2} (ref >59)
GFR calc non Af Amer: 74 mL/min/{1.73_m2} (ref >59)
Globulin, Total: 2.4 g/dL (ref 1.5–4.5)
Glucose: 90 mg/dL (ref 65–99)
Potassium: 4.9 mmol/L (ref 3.5–5.2)
Sodium: 146 mmol/L — ABNORMAL HIGH (ref 134–144)
Total Protein: 6.9 g/dL (ref 6.0–8.5)

## 2017-09-19 LAB — CBC WITH DIFFERENTIAL/PLATELET
Basophils Absolute: 0 10*3/uL (ref 0.0–0.2)
Basos: 1 %
EOS (ABSOLUTE): 0.1 10*3/uL (ref 0.0–0.4)
Eos: 2 %
Hematocrit: 46.6 % (ref 37.5–51.0)
Hemoglobin: 15.5 g/dL (ref 13.0–17.7)
Immature Grans (Abs): 0 10*3/uL (ref 0.0–0.1)
Immature Granulocytes: 0 %
Lymphocytes Absolute: 1.4 10*3/uL (ref 0.7–3.1)
Lymphs: 24 %
MCH: 28.9 pg (ref 26.6–33.0)
MCHC: 33.3 g/dL (ref 31.5–35.7)
MCV: 87 fL (ref 79–97)
Monocytes Absolute: 0.5 10*3/uL (ref 0.1–0.9)
Monocytes: 8 %
Neutrophils Absolute: 3.7 10*3/uL (ref 1.4–7.0)
Neutrophils: 65 %
Platelets: 278 10*3/uL (ref 150–379)
RBC: 5.36 x10E6/uL (ref 4.14–5.80)
RDW: 14.1 % (ref 12.3–15.4)
WBC: 5.7 10*3/uL (ref 3.4–10.8)

## 2017-10-27 ENCOUNTER — Encounter: Payer: Self-pay | Admitting: Family Medicine

## 2017-10-27 ENCOUNTER — Ambulatory Visit (INDEPENDENT_AMBULATORY_CARE_PROVIDER_SITE_OTHER): Payer: PPO

## 2017-10-27 ENCOUNTER — Ambulatory Visit: Payer: PPO | Admitting: Family Medicine

## 2017-10-27 ENCOUNTER — Other Ambulatory Visit: Payer: Self-pay

## 2017-10-27 VITALS — BP 120/78 | HR 59 | Temp 98.0°F | Resp 16 | Ht 70.08 in | Wt 204.0 lb

## 2017-10-27 DIAGNOSIS — S83422A Sprain of lateral collateral ligament of left knee, initial encounter: Secondary | ICD-10-CM

## 2017-10-27 DIAGNOSIS — M179 Osteoarthritis of knee, unspecified: Secondary | ICD-10-CM | POA: Diagnosis not present

## 2017-10-27 DIAGNOSIS — M791 Myalgia, unspecified site: Secondary | ICD-10-CM | POA: Diagnosis not present

## 2017-10-27 DIAGNOSIS — M6281 Muscle weakness (generalized): Secondary | ICD-10-CM | POA: Diagnosis not present

## 2017-10-27 DIAGNOSIS — S43422D Sprain of left rotator cuff capsule, subsequent encounter: Secondary | ICD-10-CM

## 2017-10-27 DIAGNOSIS — M19012 Primary osteoarthritis, left shoulder: Secondary | ICD-10-CM | POA: Diagnosis not present
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
## 2017-10-27 NOTE — Patient Instructions (Addendum)
Hold atorvastatin until 01/12/2018.  Shoulder Impingement Syndrome Rehab Ask your health care provider which exercises are safe for you. Do exercises exactly as told by your health care provider and adjust them as directed. It is normal to feel mild stretching, pulling, tightness, or discomfort as you do these exercises, but you should stop right away if you feel sudden pain or your pain gets worse.Do not begin these exercises until told by your health care provider. Stretching and range of motion exercise This exercise warms up your muscles and joints and improves the movement and flexibility of your shoulder. This exercise also helps to relieve pain and stiffness. Exercise A: Passive horizontal adduction  1. Sit or stand and pull your left / right elbow across your chest, toward your other shoulder. Stop when you feel a gentle stretch in the back of your shoulder and upper arm. ? Keep your arm at shoulder height. ? Keep your arm as close to your body as you comfortably can. 2. Hold for __________ seconds. 3. Slowly return to the starting position. Repeat __________ times. Complete this exercise __________ times a day. Strengthening exercises These exercises build strength and endurance in your shoulder. Endurance is the ability to use your muscles for a long time, even after they get tired. Exercise B: External rotation, isometric 1. Stand or sit in a doorway, facing the door frame. 2. Bend your left / right elbow and place the back of your wrist against the door frame. Only your wrist should be touching the frame. Keep your upper arm at your side. 3. Gently press your wrist against the door frame, as if you are trying to push your arm away from your abdomen. ? Avoid shrugging your shoulder while you press your hand against the door frame. Keep your shoulder blade tucked down toward the middle of your back. 4. Hold for __________ seconds. 5. Slowly release the tension, and relax your  muscles completely before you do the exercise again. Repeat __________ times. Complete this exercise __________ times a day. Exercise C: Internal rotation, isometric  1. Stand or sit in a doorway, facing the door frame. 2. Bend your left / right elbow and place the inside of your wrist against the door frame. Only your wrist should be touching the frame. Keep your upper arm at your side. 3. Gently press your wrist against the door frame, as if you are trying to push your arm toward your abdomen. ? Avoid shrugging your shoulder while you press your hand against the door frame. Keep your shoulder blade tucked down toward the middle of your back. 4. Hold for __________ seconds. 5. Slowly release the tension, and relax your muscles completely before you do the exercise again. Repeat __________ times. Complete this exercise __________ times a day. Exercise D: Scapular protraction, supine  1. Lie on your back on a firm surface. Hold a __________ weight in your left / right hand. 2. Raise your left / right arm straight into the air so your hand is directly above your shoulder joint. 3. Push the weight into the air so your shoulder lifts off of the surface that you are lying on. Do not move your head, neck, or back. 4. Hold for __________ seconds. 5. Slowly return to the starting position. Let your muscles relax completely before you repeat this exercise. Repeat __________ times. Complete this exercise __________ times a day. Exercise E: Scapular retraction  1. Sit in a stable chair without armrests, or stand. 2. Secure  an exercise band to a stable object in front of you so the band is at shoulder height. 3. Hold one end of the exercise band in each hand. Your palms should face down. 4. Squeeze your shoulder blades together and move your elbows slightly behind you. Do not shrug your shoulders while you do this. 5. Hold for __________ seconds. 6. Slowly return to the starting position. Repeat  __________ times. Complete this exercise __________ times a day. Exercise F: Shoulder extension  1. Sit in a stable chair without armrests, or stand. 2. Secure an exercise band to a stable object in front of you where the band is above shoulder height. 3. Hold one end of the exercise band in each hand. 4. Straighten your elbows and lift your hands up to shoulder height. 5. Squeeze your shoulder blades together and pull your hands down to the sides of your thighs. Stop when your hands are straight down by your sides. Do not let your hands go behind your body. 6. Hold for __________ seconds. 7. Slowly return to the starting position. Repeat __________ times. Complete this exercise __________ times a day. This information is not intended to replace advice given to you by your health care provider. Make sure you discuss any questions you have with your health care provider. Document Released: 10/31/2005 Document Revised: 07/07/2016 Document Reviewed: 10/03/2015 Elsevier Interactive Patient Education  2018 [employer].   IF you received an x-ray today, you will receive an invoice from [facility]. Please contact [facility] at [phone] with questions or concerns regarding your invoice.   IF you received labwork today, you will receive an invoice from [facility]. Please contact [facility] at [phone] with questions or concerns regarding your invoice.   Our billing staff will not be able to assist you with questions regarding bills from these companies.  You will be contacted with the lab results as soon as they are available. The fastest way to get your results is to activate your My Chart account. Instructions are located on the last page of this paperwork. If you have not heard from [place] regarding the results in 2 weeks, please contact this office.

## 2017-10-28 LAB — CBC WITH DIFFERENTIAL/PLATELET
Basophils Absolute: 0 10*3/uL (ref 0.0–0.2)
Basos: 1 %
EOS (ABSOLUTE): 0.1 10*3/uL (ref 0.0–0.4)
Eos: 2 %
Hematocrit: 41.8 % (ref 37.5–51.0)
Hemoglobin: 13.4 g/dL (ref 13.0–17.7)
Immature Grans (Abs): 0 10*3/uL (ref 0.0–0.1)
Immature Granulocytes: 0 %
Lymphocytes Absolute: 1.8 10*3/uL (ref 0.7–3.1)
Lymphs: 27 %
MCH: 27.7 pg (ref 26.6–33.0)
MCHC: 32.1 g/dL (ref 31.5–35.7)
MCV: 86 fL (ref 79–97)
Monocytes Absolute: 0.7 10*3/uL (ref 0.1–0.9)
Monocytes: 10 %
Neutrophils Absolute: 4 10*3/uL (ref 1.4–7.0)
Neutrophils: 60 %
Platelets: 286 10*3/uL (ref 150–379)
RBC: 4.84 x10E6/uL (ref 4.14–5.80)
RDW: 14 % (ref 12.3–15.4)
WBC: 6.6 10*3/uL (ref 3.4–10.8)

## 2017-10-28 LAB — COMPREHENSIVE METABOLIC PANEL
ALT: 18 IU/L (ref 0–44)
AST: 20 IU/L (ref 0–40)
Albumin/Globulin Ratio: 2 (ref 1.2–2.2)
Albumin: 4.5 g/dL (ref 3.6–4.8)
Alkaline Phosphatase: 86 IU/L (ref 39–117)
BUN/Creatinine Ratio: 13 (ref 10–24)
BUN: 11 mg/dL (ref 8–27)
Bilirubin Total: 0.6 mg/dL (ref 0.0–1.2)
CO2: 24 mmol/L (ref 20–29)
Calcium: 9.3 mg/dL (ref 8.6–10.2)
Chloride: 103 mmol/L (ref 96–106)
Creatinine, Ser: 0.82 mg/dL (ref 0.76–1.27)
GFR calc Af Amer: 104 mL/min/{1.73_m2} (ref >59)
GFR calc non Af Amer: 90 mL/min/{1.73_m2} (ref >59)
Globulin, Total: 2.2 g/dL (ref 1.5–4.5)
Glucose: 85 mg/dL (ref 65–99)
Potassium: 4.4 mmol/L (ref 3.5–5.2)
Sodium: 139 mmol/L (ref 134–144)
Total Protein: 6.7 g/dL (ref 6.0–8.5)

## 2017-10-28 LAB — SEDIMENTATION RATE: Sed Rate: 12 mm/hr (ref 0–30)

## 2017-10-28 LAB — CK: Total CK: 60 U/L (ref 24–204)

## 2017-11-09 ENCOUNTER — Telehealth: Payer: Self-pay

## 2017-11-09 NOTE — Telephone Encounter (Signed)
Copied from [place] [phone]. Topic: Inquiry >> Nov 08, 2017 11:15 AM Carl Henry, NT wrote: Reason for CRM: patient is calling to get his Xray results and if you can not get him on the phone please leave a message. Please advise

## 2017-11-09 NOTE — Telephone Encounter (Signed)
Phone call to patient. Patient notified of xray results. He states that Dr. Tamala Julian was going to send a referral to physical therapy, is inquiring about the status. Referrals, please advise.

## 2017-11-09 NOTE — Telephone Encounter (Signed)
Pt requesting status of PT referral. Will send once OV notes are closed. Thanks!

## 2017-11-16 ENCOUNTER — Telehealth: Payer: Self-pay | Admitting: Family Medicine

## 2017-11-16 NOTE — Telephone Encounter (Signed)
Please advise 

## 2017-11-16 NOTE — Telephone Encounter (Signed)
Copied from [place]. Topic: Referral - Request >> Nov 16, 2017 11:45 AM Cecelia Byars, NT wrote: Reason for CRM: Patient called again concerning the status of a referral for pt at the [facility] with [place] call [phone]/    please call the patient at  [phone]

## 2017-11-21 NOTE — Telephone Encounter (Signed)
Note completed; please process referral.

## 2017-11-21 NOTE — Telephone Encounter (Signed)
Note from December 14 completed.  Refer to physical therapy placed.  Please refer to Derrell Lolling at the [facility] downtown per patient request.  Thanks so much.

## 2017-11-29 DIAGNOSIS — M25562 Pain in left knee: Secondary | ICD-10-CM | POA: Diagnosis not present

## 2017-11-29 DIAGNOSIS — M25512 Pain in left shoulder: Secondary | ICD-10-CM | POA: Diagnosis not present

## 2017-12-06 DIAGNOSIS — M25512 Pain in left shoulder: Secondary | ICD-10-CM | POA: Diagnosis not present

## 2017-12-06 DIAGNOSIS — M25562 Pain in left knee: Secondary | ICD-10-CM | POA: Diagnosis not present

## 2017-12-08 DIAGNOSIS — M25512 Pain in left shoulder: Secondary | ICD-10-CM | POA: Diagnosis not present

## 2017-12-08 DIAGNOSIS — M25562 Pain in left knee: Secondary | ICD-10-CM | POA: Diagnosis not present

## 2017-12-14 DIAGNOSIS — M25562 Pain in left knee: Secondary | ICD-10-CM | POA: Diagnosis not present

## 2017-12-14 DIAGNOSIS — M25512 Pain in left shoulder: Secondary | ICD-10-CM | POA: Diagnosis not present

## 2017-12-20 DIAGNOSIS — M25562 Pain in left knee: Secondary | ICD-10-CM | POA: Diagnosis not present

## 2017-12-20 DIAGNOSIS — M25512 Pain in left shoulder: Secondary | ICD-10-CM | POA: Diagnosis not present

## 2017-12-21 DIAGNOSIS — M25512 Pain in left shoulder: Secondary | ICD-10-CM | POA: Diagnosis not present

## 2017-12-21 DIAGNOSIS — M25562 Pain in left knee: Secondary | ICD-10-CM | POA: Diagnosis not present

## 2017-12-25 ENCOUNTER — Ambulatory Visit: Payer: PPO | Admitting: Family Medicine

## 2017-12-25 DIAGNOSIS — M25562 Pain in left knee: Secondary | ICD-10-CM | POA: Diagnosis not present

## 2017-12-25 DIAGNOSIS — M25512 Pain in left shoulder: Secondary | ICD-10-CM | POA: Diagnosis not present

## 2017-12-27 DIAGNOSIS — M25562 Pain in left knee: Secondary | ICD-10-CM | POA: Diagnosis not present

## 2017-12-27 DIAGNOSIS — M25512 Pain in left shoulder: Secondary | ICD-10-CM | POA: Diagnosis not present

## 2018-01-01 ENCOUNTER — Encounter: Payer: Self-pay | Admitting: Family Medicine

## 2018-01-01 ENCOUNTER — Other Ambulatory Visit: Payer: Self-pay

## 2018-01-01 ENCOUNTER — Ambulatory Visit: Payer: PPO | Admitting: Family Medicine

## 2018-01-01 VITALS — BP 122/68 | HR 76 | Temp 98.0°F | Resp 17 | Ht 69.69 in | Wt 208.0 lb

## 2018-01-01 DIAGNOSIS — M19012 Primary osteoarthritis, left shoulder: Secondary | ICD-10-CM

## 2018-01-01 DIAGNOSIS — E7800 Pure hypercholesterolemia, unspecified: Secondary | ICD-10-CM | POA: Diagnosis not present

## 2018-01-01 DIAGNOSIS — R5381 Other malaise: Secondary | ICD-10-CM

## 2018-01-01 DIAGNOSIS — M1712 Unilateral primary osteoarthritis, left knee: Secondary | ICD-10-CM

## 2018-01-01 NOTE — Progress Notes (Signed)
Subjective:    [name], male    DOB: [birth year], 72 y.o.   MRN: [id number]  01/01/2018  Hyperlipidemia (follow-up ) and Muscle Pain    HPI This 72 y.o. male presents for evaluation of myalgias and hypercholesterolemia.  Management changes made at last visit include the following:  New onset arthralgias and myalgias with muscle weakness.  Obtain left shoulder x-ray.  Obtain left knee x-ray.  Obtain labs including CK NSAID mentation rate to rule out underlying systemic pathology.  Refer to physical therapy for further evaluation. Hold atorvastatin for the next 2 months to rule out medication side effect to current symptoms. Close follow-up in 2 months for reevaluation. Home exercise program provided for shoulder sprain. Labs are normal: White blood cell count is normal. No evidence of anemia. Sugar/glucose is normal. Liver and kidney functions are normal. Sedimentation rate is a nonspecific marker of inflammation in the body. Your level is normal. CK represents muscle enzyme activity. Your level is normal. There is no evidence of muscle inflammation present. LEFT shoulder xray showed progressive OA; LEFT knee xray showed arthritis.  Update:  L shoulder osteoarthritis: participating in PT.  Some improvement.  Good relationship; trust with care.  Orthopedist is local.  Reluctant to undergo steroid injection or surgical correction.  L knee osteoarthritis: intermittent pain.  No daily pain.  Usually when weather changes.  Twists also.   Still feels weak in arms.  Not able to exercise and lift weights.  No myalgias; feels Seeing PT twice weekly.  Follow-up with silver sneakers; also doing exercise in addition.  Lifting or pulling is a struggle; not as strong.  If Dad fell on the floor, not as able to pick up father as was in previous years. Twelve years ago, able to lift people up.  Can get groceries out of car.  No numbness tingling or burning. No falling.  Working  on balance.    Would like to check lipid today; no statin therapy for two months.  Has been taking statin for 25 years.    BP Readings from Last 3 Encounters:  01/01/18 122/68  10/27/17 120/78  09/18/17 122/72   Wt Readings from Last 3 Encounters:  01/01/18 208 lb (94.3 kg)  10/27/17 204 lb (92.5 kg)  09/18/17 207 lb (93.9 kg)   Immunization History  Administered Date(s) Administered  . Hepatitis A 03/22/2010, 03/02/2011  . Influenza,inj,Quad PF,6+ Mos 08/29/2013, 08/25/2014, 09/21/2015, 09/14/2016  . Influenza-Unspecified 08/28/2017  . Pneumococcal Conjugate-13 08/25/2014  . Pneumococcal Polysaccharide-23 08/29/2013  . Td 08/14/2004  . Tdap 03/04/2008    Review of Systems  Constitutional: Negative for activity change, appetite change, chills, diaphoresis, fatigue and fever.  Respiratory: Negative for cough and shortness of breath.   Cardiovascular: Negative for chest pain, palpitations and leg swelling.  Gastrointestinal: Negative for abdominal pain, diarrhea, nausea and vomiting.  Endocrine: Negative for cold intolerance, heat intolerance, polydipsia, polyphagia and polyuria.  Musculoskeletal: Positive for arthralgias and myalgias.  Skin: Negative for color change, rash and wound.  Neurological: Negative for dizziness, tremors, seizures, syncope, facial asymmetry, speech difficulty, weakness, light-headedness, numbness and headaches.  Psychiatric/Behavioral: Negative for dysphoric mood and sleep disturbance. The patient is not nervous/anxious.     Past Medical History:  Diagnosis Date  . Allergy   . GERD (gastroesophageal reflux disease)   . Hyperlipidemia   . Nasal polyps    Past Surgical History:  Procedure Laterality Date  . COLONOSCOPY  04/09/13   Two polyps.  Outlaw/[facility].  Repeat 5 years.  [name] NASAL POLYP SURGERY    . NASAL SINUS SURGERY  11/15/2003   x 3.  [name].   Allergies  Allergen Reactions  . Latex Swelling    OF HANDS   Current Outpatient  Medications on File Prior to Visit  Medication Sig Dispense Refill  . atorvastatin (LIPITOR) 20 MG tablet Take 1 tablet (20 mg total) by mouth daily at 6 PM. 90 tablet 3  . montelukast (SINGULAIR) 10 MG tablet Take 1 tablet (10 mg total) by mouth at bedtime. 90 tablet 3  . omeprazole (PRILOSEC) 40 MG capsule take 1 capsule by mouth once daily BEFORE DINNER  0  . OVER THE COUNTER MEDICATION     . PRESCRIPTION MEDICATION Nasacort nasal spray taking prn    . vitamin E 100 UNIT capsule Take by mouth daily.     No current facility-administered medications on file prior to visit.    Social History   Socioeconomic History  . Marital status: Married    Spouse name: Jan  . Number of children: 3  . Years of education: Not on file  . Highest education level: Not on file  Social Needs  . Financial resource strain: Not on file  . Food insecurity - worry: Not on file  . Food insecurity - inability: Not on file  . Transportation needs - medical: Not on file  . Transportation needs - non-medical: Not on file  Occupational History  . Occupation: Retired    Fish farm manager: RETIRED    Comment: Warden/ranger  Tobacco Use  . Smoking status: Never Smoker  . Smokeless tobacco: Never Used  Substance and Sexual Activity  . Alcohol use: No    Alcohol/week: 0.0 oz  . Drug use: No  . Sexual activity: Yes    Partners: Female    Birth control/protection: Post-menopausal  Other Topics Concern  . Not on file  Social History Narrative   Marital status: married x 46 years.      Children: 3 children (twin sons); 6 grandchildren.      Lives: with wife, father.      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        Teacher x 5 years.      Tobacco: none      Alcohol: none currently; stopped several years ago.       Exercise: walking daily; goes to [employer] five days per week in 2018.         Seatbelt: 100%      ADLs: independent with all  ADLs; drives; no assistant devices for ambulation.      Advance Directives: +LIVING WILL; FULL CODE; no prolonged measures.         Family History  Problem Relation Age of Onset  . Osteoporosis Mother   . Hyperlipidemia Father   . Hypertension Father   . Arthritis Father   . Depression Father   . COPD Sister   . COPD Maternal Grandfather        Objective:    BP 122/68   Pulse 76   Temp 98 F (36.7 C) (Oral)   Resp 17   Ht 5' 9.69" (1.77 m)   Wt 208 lb (94.3 kg)   SpO2 97%   BMI 30.12 kg/m  Physical Exam  Constitutional: He is oriented to person, place, and time. [name]. No distress.  HENT:  Head: Normocephalic and atraumatic.  Right Ear: External ear normal.  Left Ear: External ear normal.  Nose: Nose normal.  Mouth/Throat: Oropharynx is clear and moist.  Eyes: Conjunctivae and EOM are normal. Pupils are equal, round, and reactive to light.  Neck: Normal range of motion. Neck supple. Carotid bruit is not present. No thyromegaly present.  Cardiovascular: Normal rate, regular rhythm, normal heart sounds and intact distal pulses. Exam reveals no gallop and no friction rub.  No murmur heard. Pulmonary/Chest: Effort normal and breath sounds normal. He has no wheezes. He has no rales.  Musculoskeletal:       Left shoulder: He exhibits decreased range of motion, pain and decreased strength. He exhibits no swelling, no spasm and normal pulse.  Lymphadenopathy:    He has no cervical adenopathy.  Neurological: He is alert and oriented to person, place, and time. No cranial nerve deficit. He exhibits normal muscle tone. Coordination normal.  Skin: Skin is warm and dry. No rash noted. He is not diaphoretic.  Psychiatric: He has a normal mood and affect. His behavior is normal. Judgment and thought content normal.  Nursing note and vitals reviewed.  No results found. Depression screen [facility] 2/9 01/01/2018 10/27/2017 10/27/2017 09/18/2017  03/07/2017  Decreased Interest 0 0 0 0 0  Down, Depressed, Hopeless 0 0 0 0 0  PHQ - 2 Score 0 0 0 0 0   Fall Risk  01/01/2018 10/27/2017 10/27/2017 09/18/2017 03/07/2017  Falls in the past year? No No No No No        Assessment & Plan:   1. Pure hypercholesterolemia   2. Primary osteoarthritis of left shoulder   3. Primary osteoarthritis of left knee   4. Physical deconditioning     Hypercholesterolemia: Patient has been holding statin therapy for the past 6 weeks to see if generalized muscle weakness improves.  Repeat lipid panel today.  Left shoulder osteoarthritis with acute pain: Slight improvement in range of motion since initiating physical therapy.  Patient reluctant to receive steroid injection or undergo surgical correction.  Continue physical therapy.  If no improvement in 6-8 weeks, consider orthopedic consultation.  Left knee osteoarthritis: Persistent yet improved.  Minimal pain today.  Physical deconditioning.  Patient continues to suffer with generalized weakness and loss of muscle mass.  Recommend discussing with physical therapist at next visit.  Strength has decreased over the past several years which may be an age-related process; however, patient concerned about other pathology.  Recent CK level was normal as well as sedimentation rate.  Mild improvement with holding statin therapy.  Notices with heavy exertion or lifting requirements only.  Orders Placed This Encounter  Procedures  . Lipid panel    Order Specific Question:   Has the patient fasted?    Answer:   No  . Comprehensive metabolic panel    Order Specific Question:   Has the patient fasted?    Answer:   No   No orders of the defined types were placed in this encounter.   No Follow-up on file.   Ruford Dudzinski Elayne Guerin, M.D. Primary Care at [facility] previously [facility] [address] [place], [place]  [address] [phone] phone [phone] fax

## 2018-01-01 NOTE — Patient Instructions (Signed)
     IF you received an x-ray today, you will receive an invoice from [facility]. Please contact [facility] at [phone] with questions or concerns regarding your invoice.   IF you received labwork today, you will receive an invoice from [facility]. Please contact [facility] at [phone] with questions or concerns regarding your invoice.   Our billing staff will not be able to assist you with questions regarding bills from these companies.  You will be contacted with the lab results as soon as they are available. The fastest way to get your results is to activate your My Chart account. Instructions are located on the last page of this paperwork. If you have not heard from us regarding the results in 2 weeks, please contact this office.     

## 2018-01-02 LAB — COMPREHENSIVE METABOLIC PANEL
ALT: 18 IU/L (ref 0–44)
AST: 18 IU/L (ref 0–40)
Albumin/Globulin Ratio: 1.7 (ref 1.2–2.2)
Albumin: 4.3 g/dL (ref 3.5–4.8)
Alkaline Phosphatase: 95 IU/L (ref 39–117)
BUN/Creatinine Ratio: 14 (ref 10–24)
BUN: 14 mg/dL (ref 8–27)
Bilirubin Total: 0.4 mg/dL (ref 0.0–1.2)
CO2: 23 mmol/L (ref 20–29)
Calcium: 9.6 mg/dL (ref 8.6–10.2)
Chloride: 105 mmol/L (ref 96–106)
Creatinine, Ser: 0.97 mg/dL (ref 0.76–1.27)
GFR calc Af Amer: 91 mL/min/{1.73_m2} (ref >59)
GFR calc non Af Amer: 79 mL/min/{1.73_m2} (ref >59)
Globulin, Total: 2.6 g/dL (ref 1.5–4.5)
Glucose: 95 mg/dL (ref 65–99)
Potassium: 4.5 mmol/L (ref 3.5–5.2)
Sodium: 142 mmol/L (ref 134–144)
Total Protein: 6.9 g/dL (ref 6.0–8.5)

## 2018-01-02 LAB — LIPID PANEL
Chol/HDL Ratio: 7.1 ratio — ABNORMAL HIGH (ref 0.0–5.0)
Cholesterol, Total: 275 mg/dL — ABNORMAL HIGH (ref 100–199)
HDL: 39 mg/dL — ABNORMAL LOW (ref >39)
LDL Calculated: 196 mg/dL — ABNORMAL HIGH (ref 0–99)
Triglycerides: 202 mg/dL — ABNORMAL HIGH (ref 0–149)
VLDL Cholesterol Cal: 40 mg/dL (ref 5–40)

## 2018-01-03 DIAGNOSIS — M25512 Pain in left shoulder: Secondary | ICD-10-CM | POA: Diagnosis not present

## 2018-01-03 DIAGNOSIS — M25562 Pain in left knee: Secondary | ICD-10-CM | POA: Diagnosis not present

## 2018-01-05 ENCOUNTER — Encounter: Payer: Self-pay | Admitting: Family Medicine

## 2018-01-05 DIAGNOSIS — M25562 Pain in left knee: Secondary | ICD-10-CM | POA: Diagnosis not present

## 2018-01-05 DIAGNOSIS — M25512 Pain in left shoulder: Secondary | ICD-10-CM | POA: Diagnosis not present

## 2018-01-08 ENCOUNTER — Encounter: Payer: Self-pay | Admitting: Family Medicine

## 2018-01-09 ENCOUNTER — Encounter: Payer: Self-pay | Admitting: Family Medicine

## 2018-01-10 DIAGNOSIS — M25512 Pain in left shoulder: Secondary | ICD-10-CM | POA: Diagnosis not present

## 2018-01-10 DIAGNOSIS — M25562 Pain in left knee: Secondary | ICD-10-CM | POA: Diagnosis not present

## 2018-01-12 DIAGNOSIS — M25512 Pain in left shoulder: Secondary | ICD-10-CM | POA: Diagnosis not present

## 2018-01-12 DIAGNOSIS — M25562 Pain in left knee: Secondary | ICD-10-CM | POA: Diagnosis not present

## 2018-01-16 DIAGNOSIS — M25512 Pain in left shoulder: Secondary | ICD-10-CM | POA: Diagnosis not present

## 2018-01-16 DIAGNOSIS — M25562 Pain in left knee: Secondary | ICD-10-CM | POA: Diagnosis not present

## 2018-01-18 DIAGNOSIS — M25562 Pain in left knee: Secondary | ICD-10-CM | POA: Diagnosis not present

## 2018-01-18 DIAGNOSIS — M25512 Pain in left shoulder: Secondary | ICD-10-CM | POA: Diagnosis not present

## 2018-04-10 ENCOUNTER — Encounter: Payer: Self-pay | Admitting: Family Medicine

## 2018-04-12 ENCOUNTER — Encounter: Payer: Self-pay | Admitting: Family Medicine

## 2018-04-12 ENCOUNTER — Telehealth: Payer: Self-pay | Admitting: Family Medicine

## 2018-04-12 NOTE — Telephone Encounter (Signed)
I left a voicemail in regards to cancelling/rescheduling Carl Henry appt with Dr. Tamala Julian on 09/19/2018 due to provider leaving.

## 2018-04-19 ENCOUNTER — Other Ambulatory Visit: Payer: Self-pay | Admitting: Family Medicine

## 2018-04-19 NOTE — Telephone Encounter (Signed)
Atorvastatin and Montelukast refills Last OV:09/18/17 Last refill: Both on 03/09/17 90 tab/3 refills (expired) JME:QASTM Pharmacy: [facility] [address] [place] [facility] [place] [phone] (Phone) [phone] (Fax)

## 2018-04-25 NOTE — Telephone Encounter (Signed)
Singulair refilled per protocol.  At last office visit with Dr. Tamala Julian on 01/01/2018 patient had reported joint pain/myalgias, patient had withheld statin therapy for 6 weeks to see if it resolved. Provider checked on planning lipid panel, advised in mychart message to continue medication. Atorvastatin refilled per protocol.

## 2018-04-27 ENCOUNTER — Other Ambulatory Visit: Payer: Self-pay | Admitting: Family Medicine

## 2018-05-01 ENCOUNTER — Other Ambulatory Visit: Payer: Self-pay | Admitting: Family Medicine

## 2018-05-01 NOTE — Telephone Encounter (Signed)
Copied from [place] [id number]. Topic: Quick Communication - Rx Refill/Question >> May 01, 2018  2:38 PM Oliver Pila B wrote: Medication: montelukast (SINGULAIR) 10 MG tablet [[id number]]   Has the patient contacted their pharmacy? Yes.   (Agent: If no, request that the patient contact the pharmacy for the refill.) (Agent: If yes, when and what did the pharmacy advise?)  Preferred Pharmacy (with phone number or street name): [facility] on [address]: Please be advised that RX refills may take up to 3 business days. We ask that you follow-up with your pharmacy.

## 2018-05-03 ENCOUNTER — Other Ambulatory Visit: Payer: Self-pay

## 2018-05-03 ENCOUNTER — Telehealth: Payer: Self-pay

## 2018-05-03 MED ORDER — MONTELUKAST SODIUM 10 MG PO TABS: 10.0000 mg | ORAL_TABLET | Freq: Every day | ORAL | 1 refills | Status: DC

## 2018-05-03 MED ORDER — OMEPRAZOLE 40 MG PO CPDR: DELAYED_RELEASE_CAPSULE | ORAL | 0 refills | Status: DC

## 2018-05-03 NOTE — Telephone Encounter (Signed)
Pt message:  Needs refill of omeprazole;  montelukast not received by pharm-[facility].    Both ordered.

## 2018-05-03 NOTE — Telephone Encounter (Signed)
Copied from [place] [id number]. Topic: Quick Communication - Rx Refill/Question >> May 01, 2018  2:38 PM Oliver Pila B wrote: Medication: montelukast (SINGULAIR) 10 MG tablet [[id number]]   Has the patient contacted their pharmacy? Yes.   (Agent: If no, request that the patient contact the pharmacy for the refill.) (Agent: If yes, when and what did the pharmacy advise?)  Preferred Pharmacy (with phone number or street name): [facility] on [address]: Please be advised that RX refills may take up to 3 business days. We ask that you follow-up with your pharmacy. >> May 03, 2018  3:57 PM Percell Belt A wrote: [facility] [place])  She stated that they have never gotten this med previous to this refill.  They stated that they have never received the singular .  Also need refill on Omeprozole #90  Best number [phone]

## 2018-06-06 DIAGNOSIS — J069 Acute upper respiratory infection, unspecified: Secondary | ICD-10-CM | POA: Diagnosis not present

## 2018-08-01 ENCOUNTER — Other Ambulatory Visit: Payer: Self-pay

## 2018-08-01 ENCOUNTER — Other Ambulatory Visit: Payer: Self-pay | Admitting: Family Medicine

## 2018-08-01 MED ORDER — OMEPRAZOLE 40 MG PO CPDR: DELAYED_RELEASE_CAPSULE | ORAL | 0 refills | Status: DC

## 2018-09-09 ENCOUNTER — Other Ambulatory Visit: Payer: Self-pay | Admitting: Family Medicine

## 2018-09-19 ENCOUNTER — Encounter: Payer: PPO | Admitting: Family Medicine

## 2018-12-18 ENCOUNTER — Telehealth: Payer: Self-pay | Admitting: *Deleted

## 2018-12-18 NOTE — Telephone Encounter (Signed)
Left detailed message to schedule  AWV 

## 2018-12-25 ENCOUNTER — Other Ambulatory Visit: Payer: Self-pay

## 2018-12-25 ENCOUNTER — Emergency Department (HOSPITAL_COMMUNITY): Payer: No Typology Code available for payment source

## 2018-12-25 ENCOUNTER — Emergency Department (HOSPITAL_COMMUNITY)
Admission: EM | Admit: 2018-12-25 | Discharge: 2018-12-25 | Disposition: A | Discharge status: Home / Self Care | Payer: No Typology Code available for payment source | Attending: Emergency Medicine | Admitting: Emergency Medicine

## 2018-12-25 ENCOUNTER — Encounter (HOSPITAL_COMMUNITY): Payer: Self-pay | Admitting: Radiology

## 2018-12-25 DIAGNOSIS — W000XXA Fall on same level due to ice and snow, initial encounter: Secondary | ICD-10-CM | POA: Insufficient documentation

## 2018-12-25 DIAGNOSIS — Y929 Unspecified place or not applicable: Secondary | ICD-10-CM | POA: Insufficient documentation

## 2018-12-25 DIAGNOSIS — Z7982 Long term (current) use of aspirin: Secondary | ICD-10-CM | POA: Diagnosis not present

## 2018-12-25 DIAGNOSIS — S79911A Unspecified injury of right hip, initial encounter: Secondary | ICD-10-CM | POA: Diagnosis not present

## 2018-12-25 DIAGNOSIS — S299XXA Unspecified injury of thorax, initial encounter: Secondary | ICD-10-CM | POA: Diagnosis present

## 2018-12-25 DIAGNOSIS — Z79899 Other long term (current) drug therapy: Secondary | ICD-10-CM | POA: Insufficient documentation

## 2018-12-25 DIAGNOSIS — S2231XA Fracture of one rib, right side, initial encounter for closed fracture: Secondary | ICD-10-CM

## 2018-12-25 DIAGNOSIS — R000 Tachycardia, unspecified: Secondary | ICD-10-CM | POA: Insufficient documentation

## 2018-12-25 DIAGNOSIS — S0990XA Unspecified injury of head, initial encounter: Secondary | ICD-10-CM | POA: Diagnosis not present

## 2018-12-25 DIAGNOSIS — Y9301 Activity, walking, marching and hiking: Secondary | ICD-10-CM | POA: Insufficient documentation

## 2018-12-25 DIAGNOSIS — Y999 Unspecified external cause status: Secondary | ICD-10-CM | POA: Insufficient documentation

## 2018-12-25 DIAGNOSIS — S3991XA Unspecified injury of abdomen, initial encounter: Secondary | ICD-10-CM | POA: Diagnosis not present

## 2018-12-25 DIAGNOSIS — M25551 Pain in right hip: Secondary | ICD-10-CM | POA: Diagnosis not present

## 2018-12-25 DIAGNOSIS — S7001XA Contusion of right hip, initial encounter: Secondary | ICD-10-CM

## 2018-12-25 LAB — CBC WITH DIFFERENTIAL/PLATELET
Abs Immature Granulocytes: 0.03 10*3/uL (ref 0.00–0.07)
Basophils Absolute: 0.1 10*3/uL (ref 0.0–0.1)
Basophils Relative: 1 %
Eosinophils Absolute: 0.3 10*3/uL (ref 0.0–0.5)
Eosinophils Relative: 4 %
HCT: 48.8 % (ref 39.0–52.0)
Hemoglobin: 15.9 g/dL (ref 13.0–17.0)
Immature Granulocytes: 0 %
Lymphocytes Relative: 22 %
Lymphs Abs: 1.9 10*3/uL (ref 0.7–4.0)
MCH: 28.4 pg (ref 26.0–34.0)
MCHC: 32.6 g/dL (ref 30.0–36.0)
MCV: 87.3 fL (ref 80.0–100.0)
Monocytes Absolute: 0.8 10*3/uL (ref 0.1–1.0)
Monocytes Relative: 9 %
Neutro Abs: 5.7 10*3/uL (ref 1.7–7.7)
Neutrophils Relative %: 64 %
Platelets: 305 10*3/uL (ref 150–400)
RBC: 5.59 MIL/uL (ref 4.22–5.81)
RDW: 13.3 % (ref 11.5–15.5)
WBC: 8.8 10*3/uL (ref 4.0–10.5)
nRBC: 0 % (ref 0.0–0.2)

## 2018-12-25 LAB — BASIC METABOLIC PANEL
Anion gap: 8 (ref 5–15)
BUN: 10 mg/dL (ref 8–23)
CO2: 25 mmol/L (ref 22–32)
Calcium: 9.8 mg/dL (ref 8.9–10.3)
Chloride: 106 mmol/L (ref 98–111)
Creatinine, Ser: 0.91 mg/dL (ref 0.61–1.24)
GFR calc Af Amer: >60 mL/min (ref >60)
GFR calc non Af Amer: >60 mL/min (ref >60)
Glucose, Bld: 106 mg/dL — ABNORMAL HIGH (ref 70–99)
Potassium: 3.8 mmol/L (ref 3.5–5.1)
Sodium: 139 mmol/L (ref 135–145)

## 2018-12-25 LAB — I-STAT TROPONIN, ED: Troponin i, poc: 0.01 ng/mL (ref 0.00–0.08)

## 2018-12-25 LAB — PROTIME-INR
INR: 0.85
Prothrombin Time: 11.5 seconds (ref 11.4–15.2)

## 2018-12-25 MED ORDER — IOPAMIDOL (ISOVUE-300) INJECTION 61%
INTRAVENOUS | Status: AC
  Filled 2018-12-25: qty 100

## 2018-12-25 MED ORDER — IOPAMIDOL (ISOVUE-300) INJECTION 61%
100.0000 mL | Freq: Once | INTRAVENOUS | Status: AC | PRN
  Administered 2018-12-25: 100 mL via INTRAVENOUS

## 2018-12-25 MED ORDER — SODIUM CHLORIDE 0.9 % IV BOLUS
1000.0000 mL | Freq: Once | INTRAVENOUS | Status: AC
  Administered 2018-12-25: 1000 mL via INTRAVENOUS

## 2018-12-25 MED ORDER — OXYCODONE-ACETAMINOPHEN 5-325 MG PO TABS
1.0000 | ORAL_TABLET | Freq: Once | ORAL | Status: AC
  Administered 2018-12-25: 1 via ORAL
  Filled 2018-12-25: qty 1

## 2018-12-25 MED ORDER — OXYCODONE-ACETAMINOPHEN 5-325 MG PO TABS: 1.0000 | ORAL_TABLET | Freq: Four times a day (QID) | ORAL | 0 refills | Status: DC | PRN

## 2018-12-25 MED ORDER — SODIUM CHLORIDE (PF) 0.9 % IJ SOLN
INTRAMUSCULAR | Status: AC
  Filled 2018-12-25: qty 50

## 2018-12-25 NOTE — ED Notes (Signed)
Pocket knife previously checked with security, returned to pt prior to departure.

## 2018-12-25 NOTE — ED Triage Notes (Signed)
Pt to ED by friend from [facility] told pt EKG abnormal and that HR was to high. Pt currently denies chest pain. Pt reports fell during exercise on Saturday, complains of right hip pain, reports this is the initial reason to why he went to [facility].

## 2018-12-25 NOTE — ED Provider Notes (Signed)
[facility] DEPT Provider Note   CSN: [id number] Arrival date & time: 12/25/18  1052     History   Chief Complaint Chief Complaint  Patient presents with  . Abnormal ECG    HPI Carl Henry is a 73 y.o. male.  He was sent in from the [place] for evaluation of a rapid heart rate.  Patient states he went to the [place] after having some right hip and right chest wall pain after a slip and [place].  He denies loss of consciousness.  He is not on any blood thinners.  He does not have shortness of breath but will have pain with deep breath, cough and any turning of his trunk.  No abdominal pain nausea vomiting diarrhea or urinary symptoms.  No numbness or weakness.  The history is provided by the patient.  Fall  This is a new problem. The current episode started more than 2 days ago. The problem occurs constantly. The problem has been gradually improving. Associated symptoms include chest pain. Pertinent negatives include no abdominal pain, no headaches and no shortness of breath. The symptoms are aggravated by bending, twisting, sneezing and coughing. Nothing relieves the symptoms. He has tried nothing for the symptoms. The treatment provided no relief.    Past Medical History:  Diagnosis Date  . Allergy   . GERD (gastroesophageal reflux disease)   . Hyperlipidemia   . Nasal polyps     Patient Active Problem List   Diagnosis Date Noted  . 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 Pure hypercholesterolemia 02/17/2014  . Allergic rhinitis 02/17/2014    Past Surgical History:  Procedure Laterality Date  . COLONOSCOPY  04/09/13   Two polyps.  Outlaw/[facility].  Repeat 5 years.  [name] NASAL POLYP SURGERY    . NASAL SINUS SURGERY  11/15/2003   x 3.  [name].        Home Medications    Prior to Admission medications   Medication Sig Start Date End Date Taking? Authorizing Provider  aspirin 81 MG tablet Take 81 mg by mouth 3 (three) times  a week. Mon, Wed, Fri   Yes [provider]  atorvastatin (LIPITOR) 20 MG tablet TAKE 1 TABLET BY MOUTH EVERY DAY AT 6PM Patient taking differently: Take 20 mg by mouth daily.  04/25/18  Yes Wardell Honour, MD  montelukast (SINGULAIR) 10 MG tablet Take 1 tablet (10 mg total) by mouth at bedtime. 05/03/18  Yes Wardell Honour, MD  omeprazole (PRILOSEC) 40 MG capsule take 1 capsule by mouth once daily BEFORE DINNER 08/01/18  Yes Stallings, Zoe A, MD  triamcinolone (NASACORT ALLERGY 24HR) 55 MCG/ACT AERO nasal inhaler Place 2 sprays into the nose daily.   Yes [provider]  oxyCODONE-acetaminophen (PERCOCET/ROXICET) 5-325 MG tablet Take 1-2 tablets by mouth every 6 (six) hours as needed for severe pain. 12/25/18   Hayden Rasmussen, MD    Family History Family History  Problem Relation Age of Onset  . Osteoporosis Mother   . Hyperlipidemia Father   . Hypertension Father   . Arthritis Father   . Depression Father   . COPD Sister   . COPD Maternal Grandfather     Social History Social History   Tobacco Use  . Smoking status: Never Smoker  . Smokeless tobacco: Never Used  Substance Use Topics  . Alcohol use: No    Alcohol/week: 0.0 standard drinks  . Drug use: No  Allergies   Latex   Review of Systems Review of Systems  Constitutional: Negative for fever.  HENT: Negative for sore throat.   Eyes: Negative for visual disturbance.  Respiratory: Negative for shortness of breath.   Cardiovascular: Positive for chest pain.  Gastrointestinal: Negative for abdominal pain.  Genitourinary: Negative for dysuria.  Musculoskeletal: Negative for back pain.  Skin: Negative for rash.  Neurological: Negative for headaches.     Physical Exam Updated Vital Signs BP (!) 159/112   Pulse (!) 126   Temp 98.7 F (37.1 C) (Oral)   Resp 16   Ht 5\' 9"  (1.753 m)   Wt 95.3 kg   SpO2 96%   BMI 31.01 kg/m   Physical Exam Vitals signs and nursing note reviewed.    Constitutional:      Appearance: He is well-developed.  HENT:     Head: Normocephalic and atraumatic.  Eyes:     Conjunctiva/sclera: Conjunctivae normal.  Neck:     Musculoskeletal: Neck supple.  Cardiovascular:     Rate and Rhythm: Regular rhythm. Tachycardia present.     Heart sounds: No murmur.  Pulmonary:     Effort: Pulmonary effort is normal. No respiratory distress.     Breath sounds: Normal breath sounds.  Chest:     Chest wall: Tenderness (Right posterior lateral chest, no crepitus) present. No mass, deformity or crepitus.  Abdominal:     Palpations: Abdomen is soft.     Tenderness: There is no abdominal tenderness.  Musculoskeletal: Normal range of motion.        General: Tenderness present.     Right hip: He exhibits tenderness and bony tenderness. He exhibits normal range of motion, normal strength and no deformity.     Left hip: Normal.     Right knee: Normal.     Left knee: Normal.     Right ankle: Normal.     Left ankle: Normal.     Cervical back: Normal.     Lumbar back: Normal.     Right lower leg: No edema.     Left lower leg: No edema.     Comments: Tender Right greater troch. FROM of upper and lower ext without limitations  Skin:    General: Skin is warm and dry.     Capillary Refill: Capillary refill takes less than 2 seconds.  Neurological:     General: No focal deficit present.     Mental Status: He is alert and oriented to person, place, and time.     Gait: Gait normal.      ED Treatments / Results  Labs (all labs ordered are listed, but only abnormal results are displayed) Labs Reviewed  BASIC METABOLIC PANEL - Abnormal; Notable for the following components:      Result Value   Glucose, Bld 106 (*)    All other components within normal limits  CBC WITH DIFFERENTIAL/PLATELET  PROTIME-INR  I-STAT TROPONIN, ED    EKG EKG Interpretation  Date/Time:  Tuesday December 25 2018 10:59:38 EST Ventricular Rate:  140 PR Interval:    QRS  Duration: 98 QT Interval:  365 QTC Calculation: 558 R Axis:   -77 Text Interpretation:  Sinus or ectopic atrial tachycardia LAD, consider left anterior fascicular block Abnormal R-wave progression, late transition Repolarization abnormality, prob rate related Prolonged QT interval new from prior 11/05 Confirmed by Aletta Edouard [phone]) on 12/25/2018 11:25:51 AM   Radiology Ct Head Wo Contrast  Result Date: 12/25/2018 CLINICAL DATA:  Minor  head trauma fell during exercise on Saturday, RIGHT hip pain, elevated heart rate EXAM: CT HEAD WITHOUT CONTRAST TECHNIQUE: Contiguous axial images were obtained from the base of the skull through the vertex without intravenous contrast. Sagittal and coronal MPR images reconstructed from axial data set. COMPARISON:  01/31/2009 FINDINGS: Brain: Normal ventricular morphology. No midline shift or mass effect. Normal appearance of brain parenchyma. No intracranial hemorrhage or evidence of acute infarction. Question calcified extra-axial mass LEFT parietal, 12 x 6 x 12 mm suspect small meningioma. No additional masses. No extra-axial fluid collections. Vascular: Unremarkable Skull: Intact Sinuses/Orbits: Partial opacification of ethmoid air cells, LEFT frontal sinus, LEFT maxillary sinus, prior BILATERAL sinus surgery. Other: N/A IMPRESSION: No acute intracranial abnormalities. Probable small meningioma 12 x 6 x 12 mm LEFT parietal. Sinus disease and prior sinus surgery changes as above. Electronically Signed   By: Lavonia Dana M.D.   On: 12/25/2018 12:52   Ct Chest W Contrast  Result Date: 12/25/2018 CLINICAL DATA:  Fall, right hip pain EXAM: CT CHEST, ABDOMEN, AND PELVIS WITH CONTRAST TECHNIQUE: Multidetector CT imaging of the chest, abdomen and pelvis was performed following the standard protocol during bolus administration of intravenous contrast. CONTRAST:  154mL ISOVUE-300 IOPAMIDOL (ISOVUE-300) INJECTION 61% COMPARISON:  None. FINDINGS: CT CHEST FINDINGS  Cardiovascular: No significant vascular findings. Normal heart size. No pericardial effusion. Mediastinum/Nodes: No enlarged mediastinal, hilar, or axillary lymph nodes. Thyroid gland, trachea, and esophagus demonstrate no significant findings. Lungs/Pleura: Lungs are clear. No pleural effusion or pneumothorax. Musculoskeletal: There is a minimally displaced fracture of the posterior right eighth rib (series 2, image 43). No chest wall mass or suspicious bone lesions identified. CT ABDOMEN PELVIS FINDINGS Hepatobiliary: No focal liver abnormality is seen. No gallstones, gallbladder wall thickening, or biliary dilatation. Pancreas: Unremarkable. No pancreatic ductal dilatation or surrounding inflammatory changes. Spleen: Normal in size without focal abnormality. Adrenals/Urinary Tract: Adrenal glands are unremarkable. Kidneys are normal, without renal calculi, focal lesion, or hydronephrosis. Bladder is unremarkable. Stomach/Bowel: Stomach is within normal limits. Appendix is normal. No evidence of bowel wall thickening, distention, or inflammatory changes. Large burden of stool in the colon. Vascular/Lymphatic: Calcific atherosclerosis. No enlarged abdominal or pelvic lymph nodes. Reproductive: No mass or other abnormality.  Prostatomegaly. Other: No abdominal wall hernia or abnormality. No abdominopelvic ascites. Musculoskeletal: No acute or significant osseous findings. Soft tissue stranding over the right greater trochanter of the femur. IMPRESSION: 1. There is a minimally displaced fracture of the posterior right eighth rib (series 2, image 43). 2. Soft tissue stranding over the right greater trochanter of the femur. No displaced fracture of the right hip or pelvis. 3. No CT evidence of acute traumatic injury to the organs of the chest, abdomen, or pelvis. Electronically Signed   By: Eddie Candle M.D.   On: 12/25/2018 12:37   Ct Abdomen Pelvis W Contrast  Result Date: 12/25/2018 CLINICAL DATA:  Fall, right  hip pain EXAM: CT CHEST, ABDOMEN, AND PELVIS WITH CONTRAST TECHNIQUE: Multidetector CT imaging of the chest, abdomen and pelvis was performed following the standard protocol during bolus administration of intravenous contrast. CONTRAST:  146mL ISOVUE-300 IOPAMIDOL (ISOVUE-300) INJECTION 61% COMPARISON:  None. FINDINGS: CT CHEST FINDINGS Cardiovascular: No significant vascular findings. Normal heart size. No pericardial effusion. Mediastinum/Nodes: No enlarged mediastinal, hilar, or axillary lymph nodes. Thyroid gland, trachea, and esophagus demonstrate no significant findings. Lungs/Pleura: Lungs are clear. No pleural effusion or pneumothorax. Musculoskeletal: There is a minimally displaced fracture of the posterior right eighth rib (series 2, image 43).  No chest wall mass or suspicious bone lesions identified. CT ABDOMEN PELVIS FINDINGS Hepatobiliary: No focal liver abnormality is seen. No gallstones, gallbladder wall thickening, or biliary dilatation. Pancreas: Unremarkable. No pancreatic ductal dilatation or surrounding inflammatory changes. Spleen: Normal in size without focal abnormality. Adrenals/Urinary Tract: Adrenal glands are unremarkable. Kidneys are normal, without renal calculi, focal lesion, or hydronephrosis. Bladder is unremarkable. Stomach/Bowel: Stomach is within normal limits. Appendix is normal. No evidence of bowel wall thickening, distention, or inflammatory changes. Large burden of stool in the colon. Vascular/Lymphatic: Calcific atherosclerosis. No enlarged abdominal or pelvic lymph nodes. Reproductive: No mass or other abnormality.  Prostatomegaly. Other: No abdominal wall hernia or abnormality. No abdominopelvic ascites. Musculoskeletal: No acute or significant osseous findings. Soft tissue stranding over the right greater trochanter of the femur. IMPRESSION: 1. There is a minimally displaced fracture of the posterior right eighth rib (series 2, image 43). 2. Soft tissue stranding over the  right greater trochanter of the femur. No displaced fracture of the right hip or pelvis. 3. No CT evidence of acute traumatic injury to the organs of the chest, abdomen, or pelvis. Electronically Signed   By: Eddie Candle M.D.   On: 12/25/2018 12:37   Dg Hip Unilat With Pelvis 2-3 Views Right  Result Date: 12/25/2018 CLINICAL DATA:  Right hip pain after fall. EXAM: DG HIP (WITH OR WITHOUT PELVIS) 2-3V RIGHT COMPARISON:  None. FINDINGS: There is no evidence of hip fracture or dislocation. Mild narrowing of right hip joint is noted without osteophyte formation. 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 No acute abnormality seen. Electronically Signed   By: Marijo Conception, M.D.   On: 12/25/2018 11:58    Procedures Procedures (including critical care time)  Medications Ordered in ED Medications  iopamidol (ISOVUE-300) 61 % injection (has no administration in time range)  sodium chloride (PF) 0.9 % injection (has no administration in time range)  sodium chloride 0.9 % bolus 1,000 mL (0 mLs Intravenous Stopped 12/25/18 1433)  iopamidol (ISOVUE-300) 61 % injection 100 mL (100 mLs Intravenous Contrast Given 12/25/18 1205)  oxyCODONE-acetaminophen (PERCOCET/ROXICET) 5-325 MG per tablet 1 tablet (1 tablet Oral Given 12/25/18 1416)  sodium chloride 0.9 % bolus 1,000 mL (1,000 mLs Intravenous New Bag/Given 12/25/18 1417)     Initial Impression / Assessment and Plan / ED Course  I have reviewed the triage vital signs and the nursing notes.  Pertinent labs & imaging results that were available during my care of the patient were reviewed by me and considered in my medical decision making (see chart for details).  Clinical Course as of Dec 25 1516  Tue Dec 25, 2018  1417 Patient's work-up significant for a rib fracture on the right.  He remains markedly tachycardic.  He said he is in a good deal of pain so we will try some pain medication a little more IV fluids.   [MB]  1517 Symptoms  feeling improved his heart rates down to the 70s.  I will send him home with a prescription for some pain medicine and he understands to return if any worsening symptoms.   [MB]    Clinical Course User Index [MB] Hayden Rasmussen, MD     Final Clinical Impressions(s) / ED Diagnoses   Final diagnoses:  Closed fracture of one rib of right side, initial encounter  Contusion of right hip, initial encounter  Tachycardia    ED Discharge Orders         Ordered  oxyCODONE-acetaminophen (PERCOCET/ROXICET) 5-325 MG tablet  Every 6 hours PRN     12/25/18 1504           Hayden Rasmussen, MD 12/25/18 1738

## 2018-12-25 NOTE — ED Notes (Signed)
Pt pocket knife given to security to be locked up.

## 2018-12-25 NOTE — Discharge Instructions (Signed)
You were sent from the [facility] for had abnormal heart rate.  You had blood work and an EKG along with a CAT scan of your head chest and abdomen.  We have identified that you have a right eighth rib fracture.  This will have to heal on its own but it is quite painful.  We are prescribing some pain medicine for breakthrough pain but please try ibuprofen first.  Your hip x-ray did not show any obvious fractures.  Please follow-up with your doctor return if any worsening symptoms

## 2018-12-25 NOTE — ED Notes (Signed)
Pt provided soup and water per his request.

## 2019-04-15 ENCOUNTER — Ambulatory Visit: Payer: Self-pay

## 2019-04-15 NOTE — Telephone Encounter (Signed)
Pt. Reports he had bronchitis in Jan. And feels like "it is back." Productive cough with white phlegm. Wheezing at night.Warm transfer to [place] in the practice. Answer Assessment - Initial Assessment Questions 1. ONSET: "When did the cough begin?"      Started 1 week ago 2. SEVERITY: "How bad is the cough today?"      Moderate 3. RESPIRATORY DISTRESS: "Describe your breathing."      No 4. FEVER: "Do you have a fever?" If so, ask: "What is your temperature, how was it measured, and when did it start?"     No 5. SPUTUM: "Describe the color of your sputum" (clear, white, yellow, green)     White 6. HEMOPTYSIS: "Are you coughing up any blood?" If so ask: "How much?" (flecks, streaks, tablespoons, etc.)     No 7. CARDIAC HISTORY: "Do you have any history of heart disease?" (e.g., heart attack, congestive heart failure)      No 8. LUNG HISTORY: "Do you have any history of lung disease?"  (e.g., pulmonary embolus, asthma, emphysema)     No 9. PE RISK FACTORS: "Do you have a history of blood clots?" (or: recent major surgery, recent prolonged travel, bedridden)     No 10. OTHER SYMPTOMS: "Do you have any other symptoms?" (e.g., runny nose, wheezing, chest pain)       Wheezing at night. 11. PREGNANCY: "Is there any chance you are pregnant?" "When was your last menstrual period?"       n/a 12. TRAVEL: "Have you traveled out of the country in the last month?" (e.g., travel history, exposures)       No  Protocols used: [place]

## 2019-04-15 NOTE — Telephone Encounter (Signed)
Appointment is scheduled for 04/16/2019 to follow-up.

## 2019-04-16 ENCOUNTER — Encounter: Payer: Self-pay | Admitting: Family Medicine

## 2019-04-16 ENCOUNTER — Other Ambulatory Visit: Payer: Self-pay

## 2019-04-16 ENCOUNTER — Telehealth (INDEPENDENT_AMBULATORY_CARE_PROVIDER_SITE_OTHER): Payer: PPO | Admitting: Family Medicine

## 2019-04-16 VITALS — BP 138/72 | HR 70 | Ht 70.0 in | Wt 202.2 lb

## 2019-04-16 DIAGNOSIS — E7800 Pure hypercholesterolemia, unspecified: Secondary | ICD-10-CM | POA: Diagnosis not present

## 2019-04-16 DIAGNOSIS — K219 Gastro-esophageal reflux disease without esophagitis: Secondary | ICD-10-CM

## 2019-04-16 DIAGNOSIS — J301 Allergic rhinitis due to pollen: Secondary | ICD-10-CM

## 2019-04-16 MED ORDER — MONTELUKAST SODIUM 10 MG PO TABS: 10.0000 mg | ORAL_TABLET | Freq: Every day | ORAL | 1 refills | Status: DC

## 2019-04-16 MED ORDER — ATORVASTATIN CALCIUM 20 MG PO TABS: 20.0000 mg | ORAL_TABLET | Freq: Every day | ORAL | 1 refills | Status: DC

## 2019-04-16 NOTE — Progress Notes (Signed)
Telemedicine Encounter- SOAP NOTE Established Patient  I discussed the limitations, risks, security and privacy concerns of performing an evaluation and management service by telephone and the availability of in person appointments. I also discussed with the patient that there may be a patient responsible charge related to this service. The patient expressed understanding and agreed to proceed.  This telephone encounter was conducted with the patient's  verbal consent via audio telecommunications: yes Patient was instructed to have this encounter in a suitably private space; and to only have persons present to whom they give permission to participate. In addition, patient identity was confirmed by use of name plus two identifiers (DOB and address).  I spent a total of 70min talking with the patient   Chief Complaint: X 1 week- cough and med refills ( atorvastatin calcium, montelukast sodium) pending   Pt states cough during the night and morning.           Subjective   Carl Henry is a 73 y.o. male established patient. Telephone visit today for medication refills on cholesterol-atorvastatin and allergies-singulair  Pt with onset of cough in Jan-transmitted to chest. Pt seen at [place] at the time.  Pt was diagnosed with bronchitis-lasted 6-8 weeks.  Pt was given antibiotics.  Pt on mucinex until 4/20-resolved. Pt clears throat-small amount of phlegm. Not think drainage noted at this time.  Pt takes singulair daily. Pt takes flonase as needed .   [facility]concern about [facility] coming back.  [facility] no [facility].   Cough worse at night-head at 45degree angle-due to GERD. Sinus drainage that inflamed epiglottis-omeprazole 20mg . No burning.  No pepcid. No antiacids. Pt states no fever.  Pt with h/o sinus drainage-4 sinus surgeries in the past.  Pt states surgeries helped with breathing but now drainage going back of throat. No sore throat  currently  Pt fell in FEB and was sent to the hospital for evaluation. Pt diagnosed with broken rib-resolved.   Patient Active Problem List   Diagnosis Date Noted  . Seasonal allergic rhinitis due to pollen 04/16/2019  . 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 Pure hypercholesterolemia 02/17/2014  . Allergic rhinitis 02/17/2014    Past Medical History:  Diagnosis Date  . Allergy   . GERD (gastroesophageal reflux disease)   . Hyperlipidemia   . Nasal polyps     Current Outpatient Medications  Medication Sig Dispense Refill  . aspirin 81 MG tablet Take 81 mg by mouth 3 (three) times a week. Mon, Wed, Fri    . atorvastatin (LIPITOR) 20 MG tablet TAKE 1 TABLET BY MOUTH EVERY DAY AT 6PM (Patient taking differently: Take 20 mg by mouth daily. ) 90 tablet 1  . montelukast (SINGULAIR) 10 MG tablet Take 1 tablet (10 mg total) by mouth at bedtime. 90 tablet 1  . omeprazole (PRILOSEC) 40 MG capsule take 1 capsule by mouth once daily BEFORE DINNER 30 capsule 0  . triamcinolone (NASACORT ALLERGY 24HR) 55 MCG/ACT AERO nasal inhaler Place 2 sprays into the nose daily.    [name] oxyCODONE-acetaminophen (PERCOCET/ROXICET) 5-325 MG tablet Take 1-2 tablets by mouth every 6 (six) hours as needed for severe pain. (Patient not taking: Reported on 04/16/2019) 15 tablet 0   No current facility-administered medications for this visit.     Allergies  Allergen Reactions  . Latex Swelling    OF HANDS    Social History   Socioeconomic History  . Marital status: Married  Spouse name: Jan  . Number of children: 3  . Years of education: Not on file  . Highest education level: Not on file  Occupational History  . Occupation: Retired    Fish farm manager: RETIRED    Comment: Warden/ranger  Social Needs  . Financial resource strain: Not on file  . Food insecurity:    Worry: Not on file    Inability: Not on file  . Transportation needs:    Medical: Not on file    Non-medical: Not on file  Tobacco Use   . Smoking status: Never Smoker  . Smokeless tobacco: Never Used  Substance and Sexual Activity  . Alcohol use: No    Alcohol/week: 0.0 standard drinks  . Drug use: No  . Sexual activity: Yes    Partners: Female    Birth control/protection: Post-menopausal  Lifestyle  . Physical activity:    Days per week: Not on file    Minutes per session: Not on file  . Stress: Not on file  Relationships  . Social connections:    Talks on phone: Not on file    Gets together: Not on file    Attends religious service: Not on file    Active member of club or organization: Not on file    Attends meetings of clubs or organizations: Not on file    Relationship status: Not on file  . Intimate partner violence:    Fear of current or ex partner: Not on file    Emotionally abused: Not on file    Physically abused: Not on file    Forced sexual activity: Not on file  Other Topics Concern  . Not on file  Social History Narrative   Marital status: married x 46 years.      Children: 3 children (twin sons); 6 grandchildren.      Lives: with wife, father.      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        Teacher x 5 years.      Tobacco: none      Alcohol: none currently; stopped several years ago.       Exercise: walking daily; goes to [employer] five days per week in 2018.         Seatbelt: 100%      ADLs: independent with all ADLs; drives; no assistant devices for ambulation.      Advance Directives: +LIVING WILL; FULL CODE; no prolonged measures.          Review of Systems  Constitutional: Negative for chills, fever and malaise/fatigue.  HENT: Positive for congestion. Negative for ear pain and sinus pain.   Respiratory: Positive for cough and sputum production. Negative for shortness of breath and wheezing.   Gastrointestinal: Negative for heartburn and nausea.    Objective   Vitals as reported by the patient: Today's Vitals    04/16/19 0945  BP: 138/72  Pulse: 70  Weight: 202 lb 3.2 oz (91.7 kg)  Height: 5\' 10"  (1.778 m)    1. Pure hypercholesterolemia Atorvastatin-rx-labwork at f/u appt 6/12  2. Seasonal allergic rhinitis due to pollen Concern cough is being triggered-add zyrtec to singulair at night and use flonase daily  3. Laryngopharyngeal reflux Increase omeprazole 20 mg to (2) daily to see if improved symptoms with cough   I discussed the assessment and treatment plan with the patient. The patient was provided an opportunity to ask questions and all were answered. The  patient agreed with the plan and demonstrated an understanding of the instructions.   The patient was advised to call back or seek an in-person evaluation if the symptoms worsen or if the condition fails to improve as anticipated.  I provided 20 minutes of non-face-to-face time during this encounter.  Brylin Stopper Hannah Beat, MD  Primary Care at [facility] 04-16-19

## 2019-04-16 NOTE — Progress Notes (Signed)
Chief Complaint: X 1 week- cough and med refills ( atorvastatin calcium, montelukast sodium) pending   Pt states cough during the night and morning.

## 2019-04-26 ENCOUNTER — Other Ambulatory Visit: Payer: No Typology Code available for payment source

## 2019-04-26 ENCOUNTER — Ambulatory Visit (INDEPENDENT_AMBULATORY_CARE_PROVIDER_SITE_OTHER): Payer: PPO | Admitting: Registered Nurse

## 2019-04-26 ENCOUNTER — Encounter: Payer: Self-pay | Admitting: Registered Nurse

## 2019-04-26 ENCOUNTER — Other Ambulatory Visit: Payer: Self-pay

## 2019-04-26 ENCOUNTER — Telehealth: Payer: Self-pay | Admitting: *Deleted

## 2019-04-26 ENCOUNTER — Telehealth: Payer: Self-pay | Admitting: Registered Nurse

## 2019-04-26 VITALS — BP 130/72 | HR 62 | Temp 98.4°F | Resp 16 | Ht 69.29 in | Wt 206.0 lb

## 2019-04-26 DIAGNOSIS — J181 Lobar pneumonia, unspecified organism: Secondary | ICD-10-CM

## 2019-04-26 DIAGNOSIS — Z20822 Contact with and (suspected) exposure to covid-19: Secondary | ICD-10-CM

## 2019-04-26 DIAGNOSIS — R6889 Other general symptoms and signs: Secondary | ICD-10-CM | POA: Diagnosis not present

## 2019-04-26 DIAGNOSIS — E7800 Pure hypercholesterolemia, unspecified: Secondary | ICD-10-CM | POA: Diagnosis not present

## 2019-04-26 DIAGNOSIS — L600 Ingrowing nail: Secondary | ICD-10-CM

## 2019-04-26 DIAGNOSIS — S2239XD Fracture of one rib, unspecified side, subsequent encounter for fracture with routine healing: Secondary | ICD-10-CM | POA: Diagnosis not present

## 2019-04-26 DIAGNOSIS — J189 Pneumonia, unspecified organism: Secondary | ICD-10-CM

## 2019-04-26 MED ORDER — AZITHROMYCIN 250 MG PO TABS: 500.0000 mg | ORAL_TABLET | Freq: Every day | ORAL | 0 refills | Status: DC

## 2019-04-26 MED ORDER — AMOXICILLIN-POT CLAVULANATE 875-125 MG PO TABS: 1.0000 | ORAL_TABLET | Freq: Two times a day (BID) | ORAL | 0 refills | Status: DC

## 2019-04-26 NOTE — Telephone Encounter (Signed)
Pt referred for COVID testing given: Age, symptoms, recent respiratory infection

## 2019-04-26 NOTE — Telephone Encounter (Signed)
  Reason for test: age, cough, recent bronchitis, history of asthma/allergies  Ins: [employer] ID: [id number]    Pt scheduled for testing today at [facility] site. Requested by Maximiano Coss, NP  Testing process reviewed; verbalizes understanding.  Pts CB #  [phone]

## 2019-04-26 NOTE — Patient Instructions (Signed)
° ° ° °  If you have lab work done today you will be contacted with your lab results within the next 2 weeks.  If you have not heard from us then please contact us. The fastest way to get your results is to register for My Chart. ° ° °IF you received an x-ray today, you will receive an invoice from [facility]. Please contact [facility] at [phone] with questions or concerns regarding your invoice.  ° °IF you received labwork today, you will receive an invoice from [facility]. Please contact [facility] at [phone] with questions or concerns regarding your invoice.  ° °Our billing staff will not be able to assist you with questions regarding bills from these companies. ° °You will be contacted with the lab results as soon as they are available. The fastest way to get your results is to activate your My Chart account. Instructions are located on the last page of this paperwork. If you have not heard from us regarding the results in 2 weeks, please contact this office. °  ° ° ° °

## 2019-04-26 NOTE — Progress Notes (Signed)
Established Patient Office Visit  Subjective:  [name], male    DOB: December 15, 1945  Age: 73 y.o. MRN: [id number]  CC:  Chief Complaint  Patient presents with  . Establish Care    HPI Carl Henry presents for visit to establish care.  He reports he had bronchitis for two months earlier this year - this resolved, mostly, but he has started on a cough that feels like it is located in his throat and has started to move to his upper chest. He denies throat pain, sinus pressure/pain, sputum production, fevers, GI symptoms, headache, change in sensation of taste/smell  He reports an ingrown toenail and a bunion - he states that he no longer has a podiatrist, his last one retired. He is hoping to get a referral today. He states that he usually gets regular pedicures that manage his ingrown toenail, however, he has not received one since COVID started.  He reports that he recently saw his family members. They are a good source of support for him. He plays Dungeons + Dragons with his two grandsons over the internet.   Past Medical History:  Diagnosis Date  . Allergy   . GERD (gastroesophageal reflux disease)   . Hyperlipidemia   . Nasal polyps     Past Surgical History:  Procedure Laterality Date  . COLONOSCOPY  04/09/13   Two polyps.  Outlaw/[facility].  Repeat 5 years.  [name] NASAL POLYP SURGERY    . NASAL SINUS SURGERY  11/15/2003   x 3.  [name].    Family History  Problem Relation Age of Onset  . Osteoporosis Mother   . Hyperlipidemia Father   . Hypertension Father   . Arthritis Father   . Depression Father   . COPD Sister   . COPD Maternal Grandfather     Social History   Socioeconomic History  . Marital status: Married    Spouse name: Jan  . Number of children: 3  . Years of education: Not on file  . Highest education level: Not on file  Occupational History  . Occupation: Retired    Fish farm manager: RETIRED    Comment: Warden/ranger  Social  Needs  . Financial resource strain: Not hard at all  . Food insecurity    Worry: Never true    Inability: Never true  . Transportation needs    Medical: No    Non-medical: No  Tobacco Use  . Smoking status: Never Smoker  . Smokeless tobacco: Never Used  Substance and Sexual Activity  . Alcohol use: No    Alcohol/week: 0.0 standard drinks  . Drug use: No  . Sexual activity: Yes    Partners: Female    Birth control/protection: Post-menopausal  Lifestyle  . Physical activity    Days per week: 0 days    Minutes per session: 0 min  . Stress: Not at all  Relationships  . Social Herbalist on phone: Three times a week    Gets together: Twice a week    Attends religious service: 1 to 4 times per year    Active member of club or organization: No    Attends meetings of clubs or organizations: Never    Relationship status: Married  . Intimate partner violence    Fear of current or ex partner: No    Emotionally abused: No    Physically abused: No    Forced sexual activity: No  Other Topics Concern  .  Not on file  Social History Narrative   Marital status: married x 46 years.      Children: 3 children (twin sons); 6 grandchildren.      Lives: with wife, father.      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        Teacher x 5 years.      Tobacco: none      Alcohol: none currently; stopped several years ago.       Exercise: walking daily; goes to [employer] five days per week in 2018.         Seatbelt: 100%      ADLs: independent with all ADLs; drives; no assistant devices for ambulation.      Advance Directives: +LIVING WILL; FULL CODE; no prolonged measures.          Outpatient Medications Prior to Visit  Medication Sig Dispense Refill  . aspirin 81 MG tablet Take 81 mg by mouth 3 (three) times a week. Mon, Wed, Fri    . atorvastatin (LIPITOR) 20 MG tablet Take 1 tablet (20 mg total) by mouth daily. 90  tablet 1  . glucosamine-chondroitin 500-400 MG tablet Take 1 tablet by mouth daily.    . montelukast (SINGULAIR) 10 MG tablet Take 1 tablet (10 mg total) by mouth at bedtime. 90 tablet 1  . omeprazole (PRILOSEC) 40 MG capsule take 1 capsule by mouth once daily BEFORE DINNER 30 capsule 0  . triamcinolone (NASACORT ALLERGY 24HR) 55 MCG/ACT AERO nasal inhaler Place 2 sprays into the nose daily.    [name] oxyCODONE-acetaminophen (PERCOCET/ROXICET) 5-325 MG tablet Take 1-2 tablets by mouth every 6 (six) hours as needed for severe pain. 15 tablet 0   No facility-administered medications prior to visit.     No Active Allergies  ROS Review of Systems  Constitutional: Negative.   HENT: Negative.  Negative for congestion, ear pain, postnasal drip, rhinorrhea, sinus pressure, sinus pain, sneezing, sore throat and trouble swallowing.   Eyes: Negative.   Respiratory: Positive for cough and wheezing. Negative for shortness of breath.   Cardiovascular: Negative.  Negative for chest pain.  Gastrointestinal: Negative.  Negative for constipation, diarrhea, nausea and vomiting.  Endocrine: Negative.   Genitourinary: Negative.   Musculoskeletal: Negative.  Negative for myalgias.  Skin: Negative.  Negative for pallor.  Allergic/Immunologic: Negative.   Neurological: Negative.   Hematological: Negative.   Psychiatric/Behavioral: Negative.   All other systems reviewed and are negative.     Objective:    Physical Exam  Constitutional: He is oriented to person, place, and time. [name].  HENT:  Head: Normocephalic and atraumatic. Not macrocephalic. Head is without Battle's sign, without abrasion and without contusion.  Right Ear: External ear normal.  Left Ear: External ear normal.  Mouth/Throat: Uvula is midline. Mucous membranes are not pale, not dry and not cyanotic. He does not have dentures. No oral lesions. No trismus in the jaw. Normal dentition. No dental  abscesses, uvula swelling, lacerations or dental caries. Posterior oropharyngeal erythema present. No oropharyngeal exudate, posterior oropharyngeal edema or tonsillar abscesses.  Neck: Normal range of motion.  Cardiovascular: Normal rate, regular rhythm, normal heart sounds and intact distal pulses. Exam reveals no gallop and no friction rub.  No murmur heard. Pulmonary/Chest: Effort normal. No tachypnea and no bradypnea. No respiratory distress. He has decreased breath sounds in the left upper field and the left middle field. He has wheezes in the left  upper field and the left middle field. He has no rhonchi. He has no rales. He exhibits no tenderness.  Musculoskeletal: Normal range of motion.  Neurological: He is alert and oriented to person, place, and time. No cranial nerve deficit.  Skin: Skin is warm and dry. No rash noted. No erythema. No pallor.  Psychiatric: He has a normal mood and affect. His behavior is normal. Judgment and thought content normal.    BP 130/72   Pulse 62   Temp 98.4 F (36.9 C) (Oral)   Resp 16   Ht 5' 9.29" (1.76 m)   Wt 206 lb (93.4 kg)   SpO2 97%   BMI 30.17 kg/m  Wt Readings from Last 3 Encounters:  04/26/19 206 lb (93.4 kg)  04/16/19 202 lb 3.2 oz (91.7 kg)  12/25/18 210 lb (95.3 kg)     Health Maintenance Due  Topic Date Due  . TETANUS/TDAP  03/04/2018    There are no preventive care reminders to display for this patient.  No results found for: TSH Lab Results  Component Value Date   WBC 8.8 12/25/2018   HGB 15.9 12/25/2018   HCT 48.8 12/25/2018   MCV 87.3 12/25/2018   PLT 305 12/25/2018   Lab Results  Component Value Date   NA 139 12/25/2018   K 3.8 12/25/2018   CO2 25 12/25/2018   GLUCOSE 106 (H) 12/25/2018   BUN 10 12/25/2018   CREATININE 0.91 12/25/2018   BILITOT 0.4 01/01/2018   ALKPHOS 95 01/01/2018   AST 18 01/01/2018   ALT 18 01/01/2018   PROT 6.9 01/01/2018   ALBUMIN 4.3 01/01/2018   CALCIUM 9.8 12/25/2018    ANIONGAP 8 12/25/2018   Lab Results  Component Value Date   CHOL 275 (H) 01/01/2018   Lab Results  Component Value Date   HDL 39 (L) 01/01/2018   Lab Results  Component Value Date   LDLCALC 196 (H) 01/01/2018   Lab Results  Component Value Date   TRIG 202 (H) 01/01/2018   Lab Results  Component Value Date   CHOLHDL 7.1 (H) 01/01/2018   No results found for: HGBA1C    Assessment & Plan:   Problem List Items Addressed This Visit      Other   Pure hypercholesterolemia   Relevant Orders   CBC with Differential/Platelet    Other Visit Diagnoses    Ingrown nail    -  Primary   Relevant Orders   Ambulatory referral to Podiatry   Closed fracture of one rib with routine healing, unspecified laterality, subsequent encounter       Relevant Orders   Vitamin D, 25-hydroxy   Pneumonia of left upper lobe due to infectious organism [facility])       Relevant Medications   amoxicillin-clavulanate (AUGMENTIN) 875-125 MG tablet   azithromycin (ZITHROMAX) 250 MG tablet      Meds ordered this encounter  Medications  . amoxicillin-clavulanate (AUGMENTIN) 875-125 MG tablet    Sig: Take 1 tablet by mouth 2 (two) times daily.    Dispense:  20 tablet    Refill:  0    Order Specific Question:   Supervising Provider    Answer:   Delia Chimes A [id number]  . azithromycin (ZITHROMAX) 250 MG tablet    Sig: Take 2 tablets (500 mg total) by mouth daily.    Dispense:  6 tablet    Refill:  0    Order Specific Question:   Supervising Provider    Answer:  STALLINGS, ZOE A [id number]    Follow-up: No follow-ups on file.   PLAN:  Pt seems to have an infection that is starting to migrate into his chest - he was seen 1 week ago by Dr Benny Lennert who appropriately did not start abx at that time, however, given his diminished breath sounds and significant wheezing on exam, there's some concern for a PNA forming. Additional concerns exist for asthma/allergies, and COVID-19. We discussed these  options together and decided that given the ongoing nature of his symptoms, abx therapy was the right choice at this time.  Referral to podiatry given.  Pt concerned over a recent rib fracture - feels as though it happened far too easily, he has family hx of significant osteoporosis, mother passed afflicted with disease at age 74. We will test his VitD today, however, we discussed that with 2 mos of bronchitis, the stress on the ribs can be significant and breaks can happen somewhat easily in the circumstances  Will follow up with labs as warranted.  Patient encouraged to call clinic with any questions, comments, or concerns.    Maximiano Coss, NP

## 2019-04-27 LAB — CBC WITH DIFFERENTIAL/PLATELET
Basophils Absolute: 0.1 10*3/uL (ref 0.0–0.2)
Basos: 1 %
EOS (ABSOLUTE): 0.3 10*3/uL (ref 0.0–0.4)
Eos: 5 %
Hematocrit: 43.7 % (ref 37.5–51.0)
Hemoglobin: 14.2 g/dL (ref 13.0–17.7)
Immature Grans (Abs): 0 10*3/uL (ref 0.0–0.1)
Immature Granulocytes: 0 %
Lymphocytes Absolute: 1.4 10*3/uL (ref 0.7–3.1)
Lymphs: 23 %
MCH: 28.6 pg (ref 26.6–33.0)
MCHC: 32.5 g/dL (ref 31.5–35.7)
MCV: 88 fL (ref 79–97)
Monocytes Absolute: 0.6 10*3/uL (ref 0.1–0.9)
Monocytes: 9 %
Neutrophils Absolute: 3.8 10*3/uL (ref 1.4–7.0)
Neutrophils: 62 %
Platelets: 267 10*3/uL (ref 150–450)
RBC: 4.96 x10E6/uL (ref 4.14–5.80)
RDW: 13.2 % (ref 11.6–15.4)
WBC: 6 10*3/uL (ref 3.4–10.8)

## 2019-04-27 LAB — COMPREHENSIVE METABOLIC PANEL
ALT: 26 IU/L (ref 0–44)
AST: 26 IU/L (ref 0–40)
Albumin/Globulin Ratio: 2.2 (ref 1.2–2.2)
Albumin: 4.6 g/dL (ref 3.7–4.7)
Alkaline Phosphatase: 78 IU/L (ref 39–117)
BUN/Creatinine Ratio: 21 (ref 10–24)
BUN: 16 mg/dL (ref 8–27)
Bilirubin Total: 0.3 mg/dL (ref 0.0–1.2)
CO2: 22 mmol/L (ref 20–29)
Calcium: 9.2 mg/dL (ref 8.6–10.2)
Chloride: 107 mmol/L — ABNORMAL HIGH (ref 96–106)
Creatinine, Ser: 0.77 mg/dL (ref 0.76–1.27)
GFR calc Af Amer: 106 mL/min/{1.73_m2} (ref >59)
GFR calc non Af Amer: 91 mL/min/{1.73_m2} (ref >59)
Globulin, Total: 2.1 g/dL (ref 1.5–4.5)
Glucose: 106 mg/dL — ABNORMAL HIGH (ref 65–99)
Potassium: 4.5 mmol/L (ref 3.5–5.2)
Sodium: 143 mmol/L (ref 134–144)
Total Protein: 6.7 g/dL (ref 6.0–8.5)

## 2019-04-27 LAB — VITAMIN D 25 HYDROXY (VIT D DEFICIENCY, FRACTURES): Vit D, 25-Hydroxy: 36.9 ng/mL (ref 30.0–100.0)

## 2019-04-27 LAB — LIPID PANEL
Chol/HDL Ratio: 4.4 ratio (ref 0.0–5.0)
Cholesterol, Total: 201 mg/dL — ABNORMAL HIGH (ref 100–199)
HDL: 46 mg/dL (ref >39)
LDL Calculated: 134 mg/dL — ABNORMAL HIGH (ref 0–99)
Triglycerides: 106 mg/dL (ref 0–149)
VLDL Cholesterol Cal: 21 mg/dL (ref 5–40)

## 2019-04-28 LAB — NOVEL CORONAVIRUS, NAA: SARS-CoV-2, NAA: NOT DETECTED

## 2019-04-29 ENCOUNTER — Encounter: Payer: Self-pay | Admitting: Registered Nurse

## 2019-04-29 ENCOUNTER — Telehealth: Payer: Self-pay | Admitting: Registered Nurse

## 2019-04-29 NOTE — Telephone Encounter (Signed)
Copied from [facility] [id number]. Topic: Referral - Request for Referral >> Apr 29, 2019  1:01 PM Gustavus Messing wrote: Reason for CRM: The patient wants NP Orland Mustard to know that he is Covid-19 free and would like to continue with the referral but does not know where to call for the referral

## 2019-05-01 NOTE — Telephone Encounter (Signed)
Spoke to patient and gave him the phone number for [facility]

## 2019-05-06 ENCOUNTER — Ambulatory Visit (INDEPENDENT_AMBULATORY_CARE_PROVIDER_SITE_OTHER): Payer: PPO

## 2019-05-06 ENCOUNTER — Other Ambulatory Visit: Payer: Self-pay | Admitting: Podiatry

## 2019-05-06 ENCOUNTER — Ambulatory Visit: Payer: PPO | Admitting: Podiatry

## 2019-05-06 ENCOUNTER — Other Ambulatory Visit: Payer: Self-pay

## 2019-05-06 ENCOUNTER — Encounter: Payer: Self-pay | Admitting: Podiatry

## 2019-05-06 VITALS — BP 136/70 | HR 65 | Temp 97.3°F | Resp 16

## 2019-05-06 DIAGNOSIS — M21622 Bunionette of left foot: Secondary | ICD-10-CM

## 2019-05-06 DIAGNOSIS — M79671 Pain in right foot: Secondary | ICD-10-CM | POA: Diagnosis not present

## 2019-05-06 DIAGNOSIS — L600 Ingrowing nail: Secondary | ICD-10-CM | POA: Diagnosis not present

## 2019-05-06 DIAGNOSIS — M779 Enthesopathy, unspecified: Secondary | ICD-10-CM

## 2019-05-06 DIAGNOSIS — M21611 Bunion of right foot: Secondary | ICD-10-CM

## 2019-05-06 DIAGNOSIS — M21619 Bunion of unspecified foot: Secondary | ICD-10-CM

## 2019-05-06 DIAGNOSIS — M778 Other enthesopathies, not elsewhere classified: Secondary | ICD-10-CM

## 2019-05-06 MED ORDER — NEOMYCIN-POLYMYXIN-HC 3.5-10000-1 OT SOLN: OTIC | 0 refills | Status: DC

## 2019-05-06 NOTE — Progress Notes (Signed)
   Subjective:    [name], male    DOB: 11/09/1946, 73 y.o.   MRN: [id number]  HPI    Review of Systems  All other systems reviewed and are negative.      Objective:   Physical Exam        Assessment & Plan:

## 2019-05-06 NOTE — Patient Instructions (Signed)

## 2019-05-07 NOTE — Progress Notes (Signed)
Subjective:   [name], male   DOB: 73 y.o.   MRN: [id number]   HPI Patient presents stating he has a lot of pain on the top of the right foot that is really been bothering me and has a prominence of the bone on the outside of the right foot and also has an ingrown toenail deformity of the left big toe medial border that is painful and makes it hard for him to wear shoe gear comfortably he is tried to trim and soak it.  Patient does not smoke likes to be active   Review of Systems  All other systems reviewed and are negative.       Objective:  Physical Exam Vitals signs and nursing note reviewed.  Constitutional:      Appearance: He is well-developed.  Pulmonary:     Effort: Pulmonary effort is normal.  Musculoskeletal: Normal range of motion.  Skin:    General: Skin is warm.  Neurological:     Mental Status: He is alert.     Neurovascular status found to be intact muscle strength is adequate range of motion within normal limits.  Patient is found to have dorsal inflammation of the midtarsal joint with fluid buildup redness on the outside of the fifth metatarsal and on the left foot is found to have incurvated medial border left hallux that is painful when pressed.  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  For the right I did do a dorsal tendon injection 3 mg Kenalog 5 mg Xylocaine advised on Voltaren gel and for the left recommended correction allowing him to read and signed consent form for removal of the nail border and patient wants surgery.  I infiltrated 60 mg like Marcaine mixture sterile prep applied and using sterile instrumentation I remove the medial border exposed matrix and applied phenol 3 applications 30 seconds  followed by alcohol lavage and sterile dressing.  Gave instructions for soaks and reappoint to recheck  X-rays right did indicate there is dorsal spur formation with moderate tailor's bunion deformity right

## 2019-06-27 ENCOUNTER — Ambulatory Visit (INDEPENDENT_AMBULATORY_CARE_PROVIDER_SITE_OTHER): Payer: PPO | Admitting: Family Medicine

## 2019-06-27 VITALS — BP 130/72 | Ht 70.0 in | Wt 199.2 lb

## 2019-06-27 DIAGNOSIS — Z0000 Encounter for general adult medical examination without abnormal findings: Secondary | ICD-10-CM | POA: Diagnosis not present

## 2019-06-27 NOTE — Progress Notes (Addendum)
Presents today for [employer] Visit   Date of last exam: 04/26/2019  Interpreter used for this visit? No  I connected with  Carl Henry on 06/27/19 by a telephone and verified that I am speaking with the correct person using two identifiers.    Patient Care Team: Maximiano Coss, NP as PCP - General (Adult Health Nurse Practitioner)   Other items to address today:   Discussed Eye/Dental Patient does wear hearing aids both ears no problems Discussed immunizations TDAP declined due to insurance   Other Screening:   Last lipid screening: 04/26/2019  ADVANCE DIRECTIVES: Discussed: yes On File no Copy Requested Materials Provided: no   Immunization status:  Immunization History  Administered Date(s) Administered   Hepatitis A 03/22/2010, 03/02/2011   Influenza, High Dose Seasonal PF 07/14/2017, 09/10/2018   Influenza,inj,Quad PF,6+ Mos 08/29/2013, 08/25/2014, 09/21/2015, 09/14/2016   Influenza-Unspecified 08/28/2017   Pneumococcal Conjugate-13 08/25/2014   Pneumococcal Polysaccharide-23 08/29/2013   Td 08/14/2004   Tdap 03/04/2008     Health Maintenance Due  Topic Date Due   TETANUS/TDAP  03/04/2018   INFLUENZA VACCINE  06/15/2019     Functional Status Survey: Is the patient deaf or have difficulty hearing?: Yes(hearing aids in both ears) Does the patient have difficulty seeing, even when wearing glasses/contacts?: No Does the patient have difficulty concentrating, remembering, or making decisions?: No Does the patient have difficulty walking or climbing stairs?: No Does the patient have difficulty dressing or bathing?: No Does the patient have difficulty doing errands alone such as visiting a doctor's office or shopping?: No    6CIT Screen 06/27/2019  What Year? 0 points  What month? 0 points  What time? 0 points  Count back from 20 0 points  Months in reverse 0 points  Repeat phrase 0 points  Total Score 0        Clinical  Support from 06/27/2019 in [facility]  AUDIT-C Score  0        Home Environment:   Live in one story home with wife  No trouble climbing stairs Scattered rugs with grippers Yes grab bars No clutter/adequate lighting   Patient Active Problem List   Diagnosis Date Noted   Seasonal allergic rhinitis due to pollen 04/16/2019   Laryngopharyngeal reflux 11/14/2016   Pure hypercholesterolemia 02/17/2014   Allergic rhinitis 02/17/2014     Past Medical History:  Diagnosis Date   Allergy    GERD (gastroesophageal reflux disease)    Hyperlipidemia    Nasal polyps      Past Surgical History:  Procedure Laterality Date   COLONOSCOPY  04/09/13   Two polyps.  Outlaw/[facility].  Repeat 5 years.   NASAL POLYP SURGERY     NASAL SINUS SURGERY  11/15/2003   x 3.  [name].     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  strain: Not hard at all   Food insecurity    Worry: Never true    Inability: Never true   Transportation needs    Medical: No    Non-medical: No  Tobacco Use   Smoking status: Never Smoker   Smokeless tobacco: Never Used  Substance and Sexual Activity   Alcohol use: No    Alcohol/week: 0.0 standard drinks   Drug use: No   Sexual activity: Yes    Partners: Female    Birth control/protection: Post-menopausal  Lifestyle   Physical activity    Days per week: 0 days    Minutes per session: 0 min   Stress: Not at all  Relationships   Social connections    Talks  on phone: Three times a week    Gets together: Twice a week    Attends religious service: 1 to 4 times per year    Active member of club or organization: No    Attends meetings of clubs or organizations: Never    Relationship status: Married   Intimate partner violence    Fear of current or ex partner: No    Emotionally abused: No    Physically abused: No    Forced sexual activity: No  Other Topics Concern   Not on file  Social History Narrative   Marital status: married x 46 years.      Children: 3 children (twin sons); 6 grandchildren.      Lives: with wife, father.      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      Tobacco: none      Alcohol: none currently; stopped several years ago.       Exercise: walking daily; goes to [employer] five days per week in 2018.         Seatbelt: 100%      ADLs: independent with all ADLs; drives; no assistant devices for ambulation.      Advance Directives: +LIVING WILL; FULL CODE; no prolonged measures.           No Active Allergies   Prior to Admission medications   Medication Sig Start Date End Date Taking? Authorizing Provider  aspirin 81 MG tablet Take 81 mg by mouth 3 (three) times a week. Mon, Wed, Fri   Yes [provider]  atorvastatin (LIPITOR) 20 MG tablet Take 1 tablet (20 mg total) by mouth daily. 04/16/19  Yes Corum, Rex Kras, MD  glucosamine-chondroitin 500-400 MG tablet Take 1 tablet by mouth daily.   Yes [provider]  loratadine (CLARITIN) 10 MG tablet Take 10 mg by mouth daily. 2 times daily   Yes [provider]  montelukast (SINGULAIR) 10 MG tablet Take 1 tablet (10 mg total) by mouth at bedtime. 04/16/19  Yes Corum, Rex Kras, MD  omeprazole (PRILOSEC) 40 MG capsule take 1 capsule by mouth once daily BEFORE DINNER 08/01/18  Yes Stallings, Zoe A, MD  triamcinolone (NASACORT ALLERGY 24HR) 55 MCG/ACT AERO nasal  inhaler Place 2 sprays into the nose daily.   Yes [provider]  amoxicillin-clavulanate (AUGMENTIN) 875-125 MG tablet Take 1 tablet by mouth 2 (two) times daily. 04/26/19   Maximiano Coss, NP  azithromycin (ZITHROMAX) 250 MG tablet Take 2 tablets (500 mg total) by mouth daily. 04/26/19   Maximiano Coss, NP  neomycin-polymyxin-hydrocortisone (CORTISPORIN) OTIC solution Apply 1-2 drops to toe after soaking twice a  day 05/06/19   Wallene Huh, DPM     Depression screen [facility] 2/9 06/27/2019 04/26/2019 04/16/2019 01/01/2018 10/27/2017  Decreased Interest 0 0 0 0 0  Down, Depressed, Hopeless 0 0 0 0 0  PHQ - 2 Score 0 0 0 0 0     Fall Risk  06/27/2019 04/26/2019 04/16/2019 01/01/2018 10/27/2017  Falls in the past year? 1 1 1  No No  Number falls in past yr: 1 0 0 - -  Comment walking missed stepped hit curb - - - -  Injury with Fall? 1 1 1  - -  Comment febuary broken ribs, scrapped knee - 12/2018- r side rib - -  Follow up Falls evaluation completed;Education provided;Falls prevention discussed - Falls evaluation completed - -      PHYSICAL EXAM: BP 130/72 Comment: taken from last visit  Ht 5\' 10"  (1.778 m)   Wt 199 lb 3.2 oz (90.4 kg) Comment: per patient  BMI 28.58 kg/m    Wt Readings from Last 3 Encounters:  06/27/19 199 lb 3.2 oz (90.4 kg)  04/26/19 206 lb (93.4 kg)  04/16/19 202 lb 3.2 oz (91.7 kg)     No exam data present    Physical Exam  1. Medicare annual wellness visit, subsequent  Education/Counseling provided regarding diet and exercise, prevention of chronic diseases, smoking/tobacco cessation, if applicable, and reviewed "Covered Medicare Preventive Services."

## 2019-06-27 NOTE — Patient Instructions (Signed)
Thank you for taking time to come for your Medicare Wellness Visit. I appreciate your ongoing commitment to your health goals. Please review the following plan we discussed and let me know if I can assist you in the future.  Julie Greer LPN  Preventive Care 65 Years and Older, Male Preventive care refers to lifestyle choices and visits with your health care provider that can promote health and wellness. This includes:  A yearly physical exam. This is also called an annual well check.  Regular dental and eye exams.  Immunizations.  Screening for certain conditions.  Healthy lifestyle choices, such as diet and exercise. What can I expect for my preventive care visit? Physical exam Your health care provider will check:  Height and weight. These may be used to calculate body mass index (BMI), which is a measurement that tells if you are at a healthy weight.  Heart rate and blood pressure.  Your skin for abnormal spots. Counseling Your health care provider may ask you questions about:  Alcohol, tobacco, and drug use.  Emotional well-being.  Home and relationship well-being.  Sexual activity.  Eating habits.  History of falls.  Memory and ability to understand (cognition).  Work and work environment. What immunizations do I need?  Influenza (flu) vaccine  This is recommended every year. Tetanus, diphtheria, and pertussis (Tdap) vaccine  You may need a Td booster every 10 years. Varicella (chickenpox) vaccine  You may need this vaccine if you have not already been vaccinated. Zoster (shingles) vaccine  You may need this after age 60. Pneumococcal conjugate (PCV13) vaccine  One dose is recommended after age 71. Pneumococcal polysaccharide (PPSV23) vaccine  One dose is recommended after age 71. Measles, mumps, and rubella (MMR) vaccine  You may need at least one dose of MMR if you were born in 1957 or later. You may also need a second dose. Meningococcal  conjugate (MenACWY) vaccine  You may need this if you have certain conditions. Hepatitis A vaccine  You may need this if you have certain conditions or if you travel or work in places where you may be exposed to hepatitis A. Hepatitis B vaccine  You may need this if you have certain conditions or if you travel or work in places where you may be exposed to hepatitis B. Haemophilus influenzae type b (Hib) vaccine  You may need this if you have certain conditions. You may receive vaccines as individual doses or as more than one vaccine together in one shot (combination vaccines). Talk with your health care provider about the risks and benefits of combination vaccines. What tests do I need? Blood tests  Lipid and cholesterol levels. These may be checked every 5 years, or more frequently depending on your overall health.  Hepatitis C test.  Hepatitis B test. Screening  Lung cancer screening. You may have this screening every year starting at age 55 if you have a 30-pack-year history of smoking and currently smoke or have quit within the past 15 years.  Colorectal cancer screening. All adults should have this screening starting at age 50 and continuing until age 75. Your health care provider may recommend screening at age 45 if you are at increased risk. You will have tests every 1-10 years, depending on your results and the type of screening test.  Prostate cancer screening. Recommendations will vary depending on your family history and other risks.  Diabetes screening. This is done by checking your blood sugar (glucose) after you have not eaten for   for a while (fasting). You may have this done every 1-3 years.  Abdominal aortic aneurysm (AAA) screening. You may need this if you are a current or former smoker.  Sexually transmitted disease (STD) testing. Follow these instructions at home: Eating and drinking  Eat a diet that includes fresh fruits and vegetables, whole grains, lean  protein, and low-fat dairy products. Limit your intake of foods with high amounts of sugar, saturated fats, and salt.  Take vitamin and mineral supplements as recommended by your health care provider.  Do not drink alcohol if your health care provider tells you not to drink.  If you drink alcohol: ? Limit how much you have to 0-2 drinks a day. ? Be aware of how much alcohol is in your drink. In the [place] one drink equals one 12 oz bottle of beer (355 mL), one 5 oz glass of wine (148 mL), or one 1 oz glass of hard liquor (44 mL). Lifestyle  Take daily care of your teeth and gums.  Stay active. Exercise for at least 30 minutes on 5 or more days each week.  Do not use any products that contain nicotine or tobacco, such as cigarettes, e-cigarettes, and chewing tobacco. If you need help quitting, ask your health care provider.  If you are sexually active, practice safe sex. Use a condom or other form of protection to prevent STIs (sexually transmitted infections).  Talk with your health care provider about taking a low-dose aspirin or statin. What's next?  Visit your health care provider once a year for a well check visit.  Ask your health care provider how often you should have your eyes and teeth checked.  Stay up to date on all vaccines. This information is not intended to replace advice given to you by your health care provider. Make sure you discuss any questions you have with your health care provider. Document Released: 11/27/2015 Document Revised: 10/25/2018 Document Reviewed: 10/25/2018 Elsevier Patient Education  2020 [employer].

## 2019-07-04 ENCOUNTER — Encounter: Payer: Self-pay | Admitting: Registered Nurse

## 2019-07-04 ENCOUNTER — Ambulatory Visit: Payer: Self-pay | Admitting: *Deleted

## 2019-07-04 NOTE — Telephone Encounter (Signed)
Cough for two-three weeks. Occasional small pieces of phlegm that are whitish. Has nasal congestion he uses Nasocort every few days that helps. Right ear has felt clogged but no longer. No sinus pressure/chest congestion/headache reported. Denies fever. Denies wheezing and no shortness of breath/heavy chest. Care Advice including NS daily for congestion. OTC cough syrup before bedtime.Try warm tea with honey to sooth throat during the day.  Use netti pot and humidifier. Increase water intake. If no improvement or worsens, or fever, call back for virtual appointment.   Reason for Disposition . [1] Dry lingering cough AND [2] recent cold symptoms  (e.g., runny nose, fever)  Answer Assessment - Initial Assessment Questions 1. ONSET: "When did the cough begin?"     About 3 weeks. 2. SEVERITY: "How bad is the cough today?"      Very bothersome, wakes him up at night.  3. RESPIRATORY DISTRESS: "Describe your breathing."      Heard wheezing one night last week, none since.  4. FEVER: "Do you have a fever?" If so, ask: "What is your temperature, how was it measured, and when did it start?"    no 5. SPUTUM: "Describe the color of your sputum" (e.g., clear, white, yellow, green), "Has there been any change recently?"     Whitish to clearish pieces of phlegm come up some of the time.   6. HEMOPTYSIS: "Are you coughing up any blood?" If so ask: "How much, flecks, streaks, tablespoons, etc.?"    no 7. CARDIAC HISTORY: "Do you have any history of heart disease?" (e.g., heart attack, congestive heart failure)      no 8. LUNG HISTORY: "Do you have any history of lung disease?"  (e.g., pulmonary embolus, asthma, emphysema/COPD)     none 9. OTHER SYMPTOMS: "Do you have any other symptoms? (e.g., runny nose, wheezing, chest pain)     Sinus congestion using nasocort every few days. 10. PREGNANCY: "Is there any chance you are pregnant?" "When was your last menstrual period?"       no 11. TRAVEL: "Have you  traveled out of the country in the last month?" (e.g., travel history, exposures)       No travels/no exposures.  Protocols used: COUGH - CHRONIC-A-AH

## 2019-07-15 ENCOUNTER — Other Ambulatory Visit: Payer: Self-pay

## 2019-07-15 ENCOUNTER — Encounter: Payer: Self-pay | Admitting: Registered Nurse

## 2019-07-15 ENCOUNTER — Ambulatory Visit (INDEPENDENT_AMBULATORY_CARE_PROVIDER_SITE_OTHER): Payer: PPO | Admitting: Registered Nurse

## 2019-07-15 ENCOUNTER — Ambulatory Visit (INDEPENDENT_AMBULATORY_CARE_PROVIDER_SITE_OTHER): Payer: PPO

## 2019-07-15 VITALS — BP 128/68 | HR 67 | Temp 98.2°F | Resp 18 | Ht 70.0 in | Wt 202.0 lb

## 2019-07-15 DIAGNOSIS — R05 Cough: Secondary | ICD-10-CM

## 2019-07-15 DIAGNOSIS — R059 Cough, unspecified: Secondary | ICD-10-CM

## 2019-07-15 DIAGNOSIS — R0602 Shortness of breath: Secondary | ICD-10-CM | POA: Diagnosis not present

## 2019-07-15 MED ORDER — BENZONATATE 200 MG PO CAPS: 200.0000 mg | ORAL_CAPSULE | Freq: Two times a day (BID) | ORAL | 0 refills | Status: DC | PRN

## 2019-07-15 MED ORDER — PROMETHAZINE-PHENYLEPHRINE 6.25-5 MG/5ML PO SYRP: 2.5000 mL | ORAL_SOLUTION | Freq: Three times a day (TID) | ORAL | 0 refills | Status: DC | PRN

## 2019-07-15 NOTE — Progress Notes (Signed)
Established Patient Office Visit  Subjective:  [name], male    DOB: October 30, 1946  Age: 73 y.o. MRN: [id number]  CC:  Chief Complaint  Patient presents with  . Cough    with some mucus x 3 weeks   . Chest Congestion    HPI Carl Henry presents for ongoing cough x 3 weeks No known exposure to COVID or sick contacts - has been self isolating to the best of his ability.  Has had waxing and waning bronchitis since January. He was last seen in early June when it was reaching a bad point - he was put on abx and given symptomatic treatment, which helped, but didn't entirely relieve symptoms. Since then, he's had symptoms return entirely for around 3 weeks. He notes a productive cough with clear sputum, no discoloration or streaking. Some shob, but can still make it up stairs and up hills without too much difficulty. He denies chest pain, but notes some rattling and heaviness with his shob. Denies headache, sinus pressure, visual changes, leg swelling.     Past Medical History:  Diagnosis Date  . Allergy   . GERD (gastroesophageal reflux disease)   . Hyperlipidemia   . Nasal polyps     Past Surgical History:  Procedure Laterality Date  . COLONOSCOPY  04/09/13   Two polyps.  Outlaw/[facility].  Repeat 5 years.  [name] NASAL POLYP SURGERY    . NASAL SINUS SURGERY  11/15/2003   x 3.  [name].    Family History  Problem Relation Age of Onset  . Osteoporosis Mother   . Hyperlipidemia Father   . Hypertension Father   . Arthritis Father   . Depression Father   . COPD Sister   . COPD Maternal Grandfather     Social History   Socioeconomic History  . Marital status: Married    Spouse name: Jan  . Number of children: 3  . Years of education: Not on file  . Highest education level: Not on file  Occupational History  . Occupation: Retired    Fish farm manager: RETIRED    Comment: Warden/ranger  Social Needs  . Financial resource strain: Not hard at all  . Food  insecurity    Worry: Never true    Inability: Never true  . Transportation needs    Medical: No    Non-medical: No  Tobacco Use  . Smoking status: Never Smoker  . Smokeless tobacco: Never Used  Substance and Sexual Activity  . Alcohol use: No    Alcohol/week: 0.0 standard drinks  . Drug use: No  . Sexual activity: Yes    Partners: Female    Birth control/protection: Post-menopausal  Lifestyle  . Physical activity    Days per week: 0 days    Minutes per session: 0 min  . Stress: Not at all  Relationships  . Social Herbalist on phone: Three times a week    Gets together: Twice a week    Attends religious service: 1 to 4 times per year    Active member of club or organization: No    Attends meetings of clubs or organizations: Never    Relationship status: Married  . Intimate partner violence    Fear of current or ex partner: No    Emotionally abused: No    Physically abused: No    Forced sexual activity: No  Other Topics Concern  . Not on file  Social History Narrative  Marital status: married x 46 years.      Children: 3 children (twin sons); 6 grandchildren.      Lives: with wife, father.      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        Teacher x 5 years.      Tobacco: none      Alcohol: none currently; stopped several years ago.       Exercise: walking daily; goes to [employer] five days per week in 2018.         Seatbelt: 100%      ADLs: independent with all ADLs; drives; no assistant devices for ambulation.      Advance Directives: +LIVING WILL; FULL CODE; no prolonged measures.          Outpatient Medications Prior to Visit  Medication Sig Dispense Refill  . aspirin 81 MG tablet Take 81 mg by mouth 3 (three) times a week. Mon, Wed, Fri    . atorvastatin (LIPITOR) 20 MG tablet Take 1 tablet (20 mg total) by mouth daily. 90 tablet 1  . glucosamine-chondroitin 500-400 MG tablet Take 1  tablet by mouth daily.    [name] loratadine (CLARITIN) 10 MG tablet Take 10 mg by mouth daily. 2 times daily    . montelukast (SINGULAIR) 10 MG tablet Take 1 tablet (10 mg total) by mouth at bedtime. 90 tablet 1  . neomycin-polymyxin-hydrocortisone (CORTISPORIN) OTIC solution Apply 1-2 drops to toe after soaking twice a day 10 mL 0  . omeprazole (PRILOSEC) 40 MG capsule take 1 capsule by mouth once daily BEFORE DINNER 30 capsule 0  . triamcinolone (NASACORT ALLERGY 24HR) 55 MCG/ACT AERO nasal inhaler Place 2 sprays into the nose daily.    [name] amoxicillin-clavulanate (AUGMENTIN) 875-125 MG tablet Take 1 tablet by mouth 2 (two) times daily. 20 tablet 0  . azithromycin (ZITHROMAX) 250 MG tablet Take 2 tablets (500 mg total) by mouth daily. 6 tablet 0   No facility-administered medications prior to visit.     No Active Allergies  ROS Review of Systems  Constitutional: Negative.   HENT: Negative.   Eyes: Negative.   Respiratory: Positive for cough, shortness of breath and wheezing.   Cardiovascular: Negative.  Negative for chest pain, palpitations and leg swelling.  Gastrointestinal: Negative.  Negative for diarrhea, nausea and vomiting.  Endocrine: Negative.   Genitourinary: Negative.   Musculoskeletal: Negative.  Negative for arthralgias and myalgias.  Skin: Negative.   Allergic/Immunologic: Negative.   Neurological: Negative.  Negative for dizziness, syncope, weakness, light-headedness, numbness and headaches.  Hematological: Negative.   Psychiatric/Behavioral: Negative.   All other systems reviewed and are negative.     Objective:    Physical Exam  Constitutional: He is oriented to person, place, and time. [name]. No distress.  Cardiovascular: Normal rate, regular rhythm and normal heart sounds. Exam reveals no gallop and no friction rub.  No murmur heard. Pulmonary/Chest: Effort normal and breath sounds normal. No respiratory distress. He has no  wheezes. He has no rales (pt reports, but not heard on exam. ). He exhibits no tenderness.  Neurological: He is alert and oriented to person, place, and time.  Skin: Skin is warm and dry. No rash noted. He is not diaphoretic. No erythema. No pallor.  Psychiatric: He has a normal mood and affect. His behavior is normal. Judgment and thought content normal.  Nursing note and vitals reviewed.   BP 128/68  Pulse 67   Temp 98.2 F (36.8 C) (Oral)   Resp 18   Ht 5\' 10"  (1.778 m)   Wt 202 lb (91.6 kg)   SpO2 97%   BMI 28.98 kg/m  Wt Readings from Last 3 Encounters:  07/15/19 202 lb (91.6 kg)  06/27/19 199 lb 3.2 oz (90.4 kg)  04/26/19 206 lb (93.4 kg)     Health Maintenance Due  Topic Date Due  . TETANUS/TDAP  03/04/2018  . INFLUENZA VACCINE  06/15/2019    There are no preventive care reminders to display for this patient.  No results found for: TSH Lab Results  Component Value Date   WBC 6.0 04/26/2019   HGB 14.2 04/26/2019   HCT 43.7 04/26/2019   MCV 88 04/26/2019   PLT 267 04/26/2019   Lab Results  Component Value Date   NA 143 04/26/2019   K 4.5 04/26/2019   CO2 22 04/26/2019   GLUCOSE 106 (H) 04/26/2019   BUN 16 04/26/2019   CREATININE 0.77 04/26/2019   BILITOT 0.3 04/26/2019   ALKPHOS 78 04/26/2019   AST 26 04/26/2019   ALT 26 04/26/2019   PROT 6.7 04/26/2019   ALBUMIN 4.6 04/26/2019   CALCIUM 9.2 04/26/2019   ANIONGAP 8 12/25/2018   Lab Results  Component Value Date   CHOL 201 (H) 04/26/2019   Lab Results  Component Value Date   HDL 46 04/26/2019   Lab Results  Component Value Date   LDLCALC 134 (H) 04/26/2019   Lab Results  Component Value Date   TRIG 106 04/26/2019   Lab Results  Component Value Date   CHOLHDL 4.4 04/26/2019   No results found for: HGBA1C    Assessment & Plan:   Problem List Items Addressed This Visit    None    Visit Diagnoses    Cough    -  Primary   Relevant Medications   benzonatate (TESSALON) 200 MG  capsule   promethazine-phenylephrine (PROMETHAZINE VC) 6.25-5 MG/5ML SYRP   Other Relevant Orders   DG Chest 2 View (Completed)   Ambulatory referral to Pulmonology      Meds ordered this encounter  Medications  . benzonatate (TESSALON) 200 MG capsule    Sig: Take 1 capsule (200 mg total) by mouth 2 (two) times daily as needed for cough.    Dispense:  20 capsule    Refill:  0    Order Specific Question:   Supervising Provider    Answer:   Delia Chimes A [id number]  . promethazine-phenylephrine (PROMETHAZINE VC) 6.25-5 MG/5ML SYRP    Sig: Take 2.5 mLs by mouth every 8 (eight) hours as needed for congestion.    Dispense:  118 mL    Refill:  0    Order Specific Question:   Supervising Provider    Answer:   Forrest Moron [id number]    Follow-up: No follow-ups on file.   PLAN  Given tessalon and cough syrup, suggest OTC decongestant for intermittent use  Ref to Pulmonology - chronic bronchitis x 8-9 months, pt would benefit from consult  Xray on site shows diffuse bibasilar opacities suggestive of scarring or atelectasis.    Patient encouraged to call clinic with any questions, comments, or concerns.   Maximiano Coss, NP

## 2019-07-15 NOTE — Patient Instructions (Signed)
° ° ° °  If you have lab work done today you will be contacted with your lab results within the next 2 weeks.  If you have not heard from us then please contact us. The fastest way to get your results is to register for My Chart. ° ° °IF you received an x-ray today, you will receive an invoice from [facility]. Please contact [facility] at [phone] with questions or concerns regarding your invoice.  ° °IF you received labwork today, you will receive an invoice from [facility]. Please contact [facility] at [phone] with questions or concerns regarding your invoice.  ° °Our billing staff will not be able to assist you with questions regarding bills from these companies. ° °You will be contacted with the lab results as soon as they are available. The fastest way to get your results is to activate your My Chart account. Instructions are located on the last page of this paperwork. If you have not heard from us regarding the results in 2 weeks, please contact this office. °  ° ° ° °

## 2019-07-16 ENCOUNTER — Encounter: Payer: Self-pay | Admitting: Registered Nurse

## 2019-07-16 ENCOUNTER — Ambulatory Visit (INDEPENDENT_AMBULATORY_CARE_PROVIDER_SITE_OTHER): Payer: PPO | Admitting: Registered Nurse

## 2019-07-16 ENCOUNTER — Other Ambulatory Visit: Payer: Self-pay

## 2019-07-16 VITALS — BP 140/70 | HR 70 | Temp 98.3°F | Resp 16 | Ht 70.0 in | Wt 202.0 lb

## 2019-07-16 DIAGNOSIS — R0609 Other forms of dyspnea: Secondary | ICD-10-CM

## 2019-07-16 DIAGNOSIS — R0600 Dyspnea, unspecified: Secondary | ICD-10-CM

## 2019-07-16 DIAGNOSIS — R9431 Abnormal electrocardiogram [ECG] [EKG]: Secondary | ICD-10-CM

## 2019-07-16 LAB — PROTIME-INR
INR: 1 (ref 0.8–1.2)
Prothrombin Time: 10.3 s (ref 9.1–12.0)

## 2019-07-16 LAB — BRAIN NATRIURETIC PEPTIDE: BNP: 30.1 pg/mL (ref 0.0–100.0)

## 2019-07-16 NOTE — Progress Notes (Signed)
Established Patient Office Visit  Subjective:  [name], male    DOB: 11-17-1945  Age: 73 y.o. MRN: [id number]  CC:  Chief Complaint  Patient presents with  . Cough    chronic with some chest congestion   . SHOB  . Cerumen Impaction    HPI Carl Henry presents for R ear cerumen impaction. Has been impacted and lavaged before. He is familiar with the process and denies other symptoms beyond some dullness to his hearing.  Otherwise, he reports his symptoms from yesterday have failed to improve - and have gotten somewhat worse. He states that after leaving our office yesterday, he attempted a light walking workout at home. Unfortunately, he couldn't make it more than 5 minutes into the program before he was overcome by coughing and shortness of breath.   He denies chest pain, headaches, visual changes, hemoptysis, GI symptoms, LOC, dizziness, or pain radiating from arms or abdomen.   He has a history significant for elevated lipids.   Past Medical History:  Diagnosis Date  . Allergy   . GERD (gastroesophageal reflux disease)   . Hyperlipidemia   . Nasal polyps     Past Surgical History:  Procedure Laterality Date  . COLONOSCOPY  04/09/13   Two polyps.  Outlaw/[facility].  Repeat 5 years.  [name] NASAL POLYP SURGERY    . NASAL SINUS SURGERY  11/15/2003   x 3.  [name].    Family History  Problem Relation Age of Onset  . Osteoporosis Mother   . Hyperlipidemia Father   . Hypertension Father   . Arthritis Father   . Depression Father   . COPD Sister   . COPD Maternal Grandfather     Social History   Socioeconomic History  . Marital status: Married    Spouse name: Jan  . Number of children: 3  . Years of education: Not on file  . Highest education level: Not on file  Occupational History  . Occupation: Retired    Fish farm manager: RETIRED    Comment: Warden/ranger  Social Needs  . Financial resource strain: Not hard at all  . Food insecurity   Worry: Never true    Inability: Never true  . Transportation needs    Medical: No    Non-medical: No  Tobacco Use  . Smoking status: Never Smoker  . Smokeless tobacco: Never Used  Substance and Sexual Activity  . Alcohol use: No    Alcohol/week: 0.0 standard drinks  . Drug use: No  . Sexual activity: Yes    Partners: Female    Birth control/protection: Post-menopausal  Lifestyle  . Physical activity    Days per week: 0 days    Minutes per session: 0 min  . Stress: Not at all  Relationships  . Social Herbalist on phone: Three times a week    Gets together: Twice a week    Attends religious service: 1 to 4 times per year    Active member of club or organization: No    Attends meetings of clubs or organizations: Never    Relationship status: Married  . Intimate partner violence    Fear of current or ex partner: No    Emotionally abused: No    Physically abused: No    Forced sexual activity: No  Other Topics Concern  . Not on file  Social History Narrative   Marital status: married x 46 years.  Children: 3 children (twin sons); 6 grandchildren.      Lives: with wife, father.      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        Teacher x 5 years.      Tobacco: none      Alcohol: none currently; stopped several years ago.       Exercise: walking daily; goes to [employer] five days per week in 2018.         Seatbelt: 100%      ADLs: independent with all ADLs; drives; no assistant devices for ambulation.      Advance Directives: +LIVING WILL; FULL CODE; no prolonged measures.          Outpatient Medications Prior to Visit  Medication Sig Dispense Refill  . aspirin 81 MG tablet Take 81 mg by mouth 3 (three) times a week. Mon, Wed, Fri    . atorvastatin (LIPITOR) 20 MG tablet Take 1 tablet (20 mg total) by mouth daily. 90 tablet 1  . benzonatate (TESSALON) 200 MG capsule Take 1 capsule (200 mg  total) by mouth 2 (two) times daily as needed for cough. 20 capsule 0  . glucosamine-chondroitin 500-400 MG tablet Take 1 tablet by mouth daily.    [name] loratadine (CLARITIN) 10 MG tablet Take 10 mg by mouth daily. 2 times daily    . montelukast (SINGULAIR) 10 MG tablet Take 1 tablet (10 mg total) by mouth at bedtime. 90 tablet 1  . neomycin-polymyxin-hydrocortisone (CORTISPORIN) OTIC solution Apply 1-2 drops to toe after soaking twice a day 10 mL 0  . omeprazole (PRILOSEC) 40 MG capsule take 1 capsule by mouth once daily BEFORE DINNER 30 capsule 0  . promethazine-phenylephrine (PROMETHAZINE VC) 6.25-5 MG/5ML SYRP Take 2.5 mLs by mouth every 8 (eight) hours as needed for congestion. 118 mL 0  . triamcinolone (NASACORT ALLERGY 24HR) 55 MCG/ACT AERO nasal inhaler Place 2 sprays into the nose daily.     No facility-administered medications prior to visit.     No Active Allergies  ROS Review of Systems Per hpi    Objective:    Physical Exam  Constitutional: He is oriented to person, place, and time. [name]. No distress.  Cardiovascular: Normal rate, regular rhythm, normal heart sounds and intact distal pulses. Exam reveals no gallop and no friction rub.  No murmur heard. Pulmonary/Chest: Effort normal and breath sounds normal. No respiratory distress. He has no wheezes. He has no rales. He exhibits no tenderness.  Neurological: He is alert and oriented to person, place, and time.  Skin: Skin is warm and dry. No rash noted. He is not diaphoretic. No erythema. No pallor.  Psychiatric: He has a normal mood and affect. His behavior is normal. Judgment and thought content normal.  Nursing note and vitals reviewed.   BP 140/70   Pulse 70   Temp 98.3 F (36.8 C) (Oral)   Resp 16   Ht 5\' 10"  (1.778 m)   Wt 202 lb (91.6 kg)   SpO2 95%   BMI 28.98 kg/m  Wt Readings from Last 3 Encounters:  07/16/19 202 lb (91.6 kg)  07/15/19 202 lb (91.6 kg)  06/27/19  199 lb 3.2 oz (90.4 kg)     Health Maintenance Due  Topic Date Due  . TETANUS/TDAP  03/04/2018  . INFLUENZA VACCINE  06/15/2019    There are no preventive care reminders to display for this patient.  No  results found for: TSH Lab Results  Component Value Date   WBC 6.0 04/26/2019   HGB 14.2 04/26/2019   HCT 43.7 04/26/2019   MCV 88 04/26/2019   PLT 267 04/26/2019   Lab Results  Component Value Date   NA 143 04/26/2019   K 4.5 04/26/2019   CO2 22 04/26/2019   GLUCOSE 106 (H) 04/26/2019   BUN 16 04/26/2019   CREATININE 0.77 04/26/2019   BILITOT 0.3 04/26/2019   ALKPHOS 78 04/26/2019   AST 26 04/26/2019   ALT 26 04/26/2019   PROT 6.7 04/26/2019   ALBUMIN 4.6 04/26/2019   CALCIUM 9.2 04/26/2019   ANIONGAP 8 12/25/2018   Lab Results  Component Value Date   CHOL 201 (H) 04/26/2019   Lab Results  Component Value Date   HDL 46 04/26/2019   Lab Results  Component Value Date   LDLCALC 134 (H) 04/26/2019   Lab Results  Component Value Date   TRIG 106 04/26/2019   Lab Results  Component Value Date   CHOLHDL 4.4 04/26/2019   No results found for: HGBA1C    Assessment & Plan:   Problem List Items Addressed This Visit    None    Visit Diagnoses    DOE (dyspnea on exertion)    -  Primary   Relevant Orders   EKG 12-Lead (Completed)   Brain natriuretic peptide (Completed)   CBC with Differential   Basic Metabolic Panel   Protime-INR (Completed)   Ambulatory referral to Cardiology   ECG abnormality       Relevant Orders   Ambulatory referral to Cardiology      No orders of the defined types were placed in this encounter.   Follow-up: No follow-ups on file.   PLAN  Pt with normal EKG on site today - but findings in February were very concerning. Though there were no acute findings on EKG today, we cannot safely rule out cardiac etiology to these symptoms. He would greatly benefit from cardiology consult and management - including echocardiogram and  exercise stress test.   Urgent Referral to Cardiology  STAT Bnp and PT INR. Routine CBC and BMP.  Patient encouraged to call clinic with any questions, comments, or concerns.   Maximiano Coss, NP

## 2019-07-16 NOTE — Patient Instructions (Signed)
° ° ° °  If you have lab work done today you will be contacted with your lab results within the next 2 weeks.  If you have not heard from us then please contact us. The fastest way to get your results is to register for My Chart. ° ° °IF you received an x-ray today, you will receive an invoice from [facility]. Please contact [facility] at [phone] with questions or concerns regarding your invoice.  ° °IF you received labwork today, you will receive an invoice from [facility]. Please contact [facility] at [phone] with questions or concerns regarding your invoice.  ° °Our billing staff will not be able to assist you with questions regarding bills from these companies. ° °You will be contacted with the lab results as soon as they are available. The fastest way to get your results is to activate your My Chart account. Instructions are located on the last page of this paperwork. If you have not heard from us regarding the results in 2 weeks, please contact this office. °  ° ° ° °

## 2019-07-17 ENCOUNTER — Emergency Department (HOSPITAL_COMMUNITY)
Admission: EM | Admit: 2019-07-17 | Discharge: 2019-07-17 | Disposition: A | Discharge status: Home / Self Care | Payer: PPO | Attending: Emergency Medicine | Admitting: Emergency Medicine

## 2019-07-17 ENCOUNTER — Encounter (HOSPITAL_COMMUNITY): Payer: Self-pay

## 2019-07-17 ENCOUNTER — Other Ambulatory Visit: Payer: Self-pay

## 2019-07-17 DIAGNOSIS — R05 Cough: Secondary | ICD-10-CM

## 2019-07-17 DIAGNOSIS — Z7982 Long term (current) use of aspirin: Secondary | ICD-10-CM | POA: Diagnosis not present

## 2019-07-17 DIAGNOSIS — R059 Cough, unspecified: Secondary | ICD-10-CM

## 2019-07-17 DIAGNOSIS — J40 Bronchitis, not specified as acute or chronic: Secondary | ICD-10-CM | POA: Insufficient documentation

## 2019-07-17 LAB — BASIC METABOLIC PANEL
BUN/Creatinine Ratio: 11 (ref 10–24)
BUN: 11 mg/dL (ref 8–27)
CO2: 23 mmol/L (ref 20–29)
Calcium: 9.4 mg/dL (ref 8.6–10.2)
Chloride: 107 mmol/L — ABNORMAL HIGH (ref 96–106)
Creatinine, Ser: 1 mg/dL (ref 0.76–1.27)
GFR calc Af Amer: 87 mL/min/{1.73_m2} (ref >59)
GFR calc non Af Amer: 75 mL/min/{1.73_m2} (ref >59)
Glucose: 91 mg/dL (ref 65–99)
Potassium: 4.3 mmol/L (ref 3.5–5.2)
Sodium: 144 mmol/L (ref 134–144)

## 2019-07-17 LAB — CBC WITH DIFFERENTIAL/PLATELET
Basophils Absolute: 0.1 10*3/uL (ref 0.0–0.2)
Basos: 1 %
EOS (ABSOLUTE): 0.5 10*3/uL — ABNORMAL HIGH (ref 0.0–0.4)
Eos: 7 %
Hematocrit: 45.6 % (ref 37.5–51.0)
Hemoglobin: 15.1 g/dL (ref 13.0–17.7)
Immature Grans (Abs): 0 10*3/uL (ref 0.0–0.1)
Immature Granulocytes: 0 %
Lymphocytes Absolute: 1.8 10*3/uL (ref 0.7–3.1)
Lymphs: 24 %
MCH: 27.7 pg (ref 26.6–33.0)
MCHC: 33.1 g/dL (ref 31.5–35.7)
MCV: 84 fL (ref 79–97)
Monocytes Absolute: 0.7 10*3/uL (ref 0.1–0.9)
Monocytes: 9 %
Neutrophils Absolute: 4.4 10*3/uL (ref 1.4–7.0)
Neutrophils: 59 %
Platelets: 277 10*3/uL (ref 150–450)
RBC: 5.46 x10E6/uL (ref 4.14–5.80)
RDW: 13.9 % (ref 11.6–15.4)
WBC: 7.5 10*3/uL (ref 3.4–10.8)

## 2019-07-17 LAB — TROPONIN I (HIGH SENSITIVITY): Troponin I (High Sensitivity): 3 ng/L (ref <18)

## 2019-07-17 NOTE — ED Notes (Signed)
[phone] Jan- spouse would like to be updated once we have an update

## 2019-07-17 NOTE — ED Notes (Signed)
Ambulated pt in hall, normal gait, slight cough, O2 lowest point was 94%

## 2019-07-17 NOTE — ED Triage Notes (Signed)
Pt presents with c/o recurring cough on and off since January. Pt reports he had bronchitis in January. Pt reports he has been in the MD's office the last couple of days attempting to figure out why he cannot get his cough to go away. No c/o sore throat at this time.

## 2019-07-17 NOTE — ED Provider Notes (Signed)
[facility] DEPT Provider Note   CSN: [id number] Arrival date & time: 07/17/19  [id number]     History   Chief Complaint Chief Complaint  Patient presents with  . Cough    HPI Carl Henry is a 73 y.o. male.     HPI  Patient presents with shortness of breath and cough.  Has had on and off since January.  States diagnosed bronchitis.  Has been given a steroid and inhalers.  Sounds of got better for a while.  Actually did fall and have broken rib in February however.  However the last 2 months has had shortness of breath and cough.  States he will cough at times.  States also he will get more short of breath and sometimes gets chest tightness with it.  States he feels if he coughs until he has difficulty breathing.  No swelling in his legs.  No history of COPD.  Has been seen by his PCP.  States his PCP saw 2 days ago and diagnosed with a chronic bronchitis.  Supposed to have pulmonary follow-up.  However saw PCP again yesterday and stated this was clearly a cardiac cause.  Was going to follow-up with cardiology.  EKG reportedly good yesterday but states it was abnormal in February.  EKG from February tachycardic with a broken rib.  No fevers.  Minimal sputum production.  States he has had more fatigue and been unable to do the same amount of exercise that would have before.  Past Medical History:  Diagnosis Date  . Allergy   . GERD (gastroesophageal reflux disease)   . Hyperlipidemia   . Nasal polyps     Patient Active Problem List   Diagnosis Date Noted  . Seasonal allergic rhinitis due to pollen 04/16/2019  . 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 Pure hypercholesterolemia 02/17/2014  . Allergic rhinitis 02/17/2014    Past Surgical History:  Procedure Laterality Date  . COLONOSCOPY  04/09/13   Two polyps.  Outlaw/[facility].  Repeat 5 years.  [name] NASAL POLYP SURGERY    . NASAL SINUS SURGERY  11/15/2003   x 3.  [name].        Home Medications     Prior to Admission medications   Medication Sig Start Date End Date Taking? Authorizing Provider  aspirin 81 MG tablet Take 81 mg by mouth 3 (three) times a week. Mon, Wed, Fri    [provider]  atorvastatin (LIPITOR) 20 MG tablet Take 1 tablet (20 mg total) by mouth daily. 04/16/19   Corum, Rex Kras, MD  benzonatate (TESSALON) 200 MG capsule Take 1 capsule (200 mg total) by mouth 2 (two) times daily as needed for cough. 07/15/19   Maximiano Coss, NP  glucosamine-chondroitin 500-400 MG tablet Take 1 tablet by mouth daily.    [provider]  loratadine (CLARITIN) 10 MG tablet Take 10 mg by mouth daily. 2 times daily    [provider]  montelukast (SINGULAIR) 10 MG tablet Take 1 tablet (10 mg total) by mouth at bedtime. 04/16/19   Maryruth Hancock, MD  neomycin-polymyxin-hydrocortisone (CORTISPORIN) OTIC solution Apply 1-2 drops to toe after soaking twice a day 05/06/19   Wallene Huh, DPM  omeprazole (PRILOSEC) 40 MG capsule take 1 capsule by mouth once daily BEFORE DINNER 08/01/18   Forrest Moron, MD  promethazine-phenylephrine (PROMETHAZINE VC) 6.25-5 MG/5ML SYRP Take 2.5 mLs by mouth every 8 (eight) hours as needed for congestion. 07/15/19   Maximiano Coss, NP  triamcinolone (NASACORT ALLERGY 24HR) 55 MCG/ACT AERO nasal inhaler Place 2 sprays into the nose daily.    [provider]    Family History Family History  Problem Relation Age of Onset  . Osteoporosis Mother   . Hyperlipidemia Father   . Hypertension Father   . Arthritis Father   . Depression Father   . COPD Sister   . COPD Maternal Grandfather     Social History Social History   Tobacco Use  . Smoking status: Never Smoker  . Smokeless tobacco: Never Used  Substance Use Topics  . Alcohol use: No    Alcohol/week: 0.0 standard drinks  . Drug use: No     Allergies   Patient has no known allergies.   Review of Systems Review of Systems  Constitutional: Positive for appetite  change and fatigue. Negative for fever.  HENT: Negative for congestion.   Respiratory: Positive for cough and shortness of breath.   Cardiovascular: Positive for chest pain.  Gastrointestinal: Negative for abdominal pain.  Genitourinary: Negative for flank pain.  Musculoskeletal: Negative for back pain.  Skin: Negative for rash.  Neurological: Negative for weakness.  Psychiatric/Behavioral: Negative for confusion.     Physical Exam Updated Vital Signs BP 140/83 (BP Location: Left Arm)   Pulse (!) 57   Temp 98.2 F (36.8 C) (Oral)   Resp 13   Ht 5\' 10"  (1.778 m)   Wt 90.7 kg   SpO2 96%   BMI 28.70 kg/m   Physical Exam Vitals signs and nursing note reviewed.  HENT:     Head: Normocephalic.  Eyes:     Extraocular Movements: Extraocular movements intact.  Neck:     Musculoskeletal: Neck supple.  Cardiovascular:     Rate and Rhythm: Regular rhythm.  Pulmonary:     Breath sounds: No wheezing, rhonchi or rales.     Comments: Patient had frequent cough after he took a deep breath for the examination. Abdominal:     Tenderness: There is no abdominal tenderness.  Musculoskeletal:     Right lower leg: No edema.     Left lower leg: No edema.  Skin:    General: Skin is warm.     Capillary Refill: Capillary refill takes less than 2 seconds.  Neurological:     General: No focal deficit present.     Mental Status: He is alert.      ED Treatments / Results  Labs (all labs ordered are listed, but only abnormal results are displayed) Labs Reviewed  TROPONIN I (HIGH SENSITIVITY)    EKG EKG Interpretation  Date/Time:  Wednesday July 17 2019 14:15:19 EDT Ventricular Rate:  57 PR Interval:    QRS Duration: 108 QT Interval:  431 QTC Calculation: 420 R Axis:   67 Text Interpretation:  Sinus rhythm RSR' in V1 or V2, probably normal variant ST elevation, consider inferior injury Confirmed by Davonna Belling [phone]) on 07/17/2019 2:46:53 PM   Radiology No results  found.  Procedures Procedures (including critical care time)  Medications Ordered in ED Medications - No data to display   Initial Impression / Assessment and Plan / ED Course  I have reviewed the triage vital signs and the nursing notes.  Pertinent labs & imaging results that were available during my care of the patient were reviewed by me and considered in my medical decision making (see chart for details).        Patient presents with cough over the last 9 months.  Comes and  goes.  Has been seen for it previously.  X-ray reassuring from 2 days ago.  Lab work also reassuring.  Added a troponin since there may be an exertional component.  EKG reassuring.  Troponin negative.  Has frequent cough and the cough does not only come on with exertion.  I think there is underlying pulmonary component less likely cardiac.  PCP is organized follow-up with pulmonology and cardiology.  Think these are both reasonable.  Will discharge home.  Final Clinical Impressions(s) / ED Diagnoses   Final diagnoses:  Bronchitis  Cough    ED Discharge Orders    None       Davonna Belling, MD 07/17/19 1510

## 2019-07-18 ENCOUNTER — Telehealth: Payer: Self-pay | Admitting: Registered Nurse

## 2019-07-18 NOTE — Telephone Encounter (Signed)
Patient would like for someone to call him and discuss some medications [phone]

## 2019-07-19 NOTE — Telephone Encounter (Signed)
Pt called stating that the cough medicine that was sent over to the pharmacy for pt is not available at any of the [facility] in town. Pt is requesting an alternate medication be sent in for him. Please advise.

## 2019-07-19 NOTE — Telephone Encounter (Signed)
Will route to office for final disposition; pt seen by Maximiano Coss, NP.

## 2019-07-23 ENCOUNTER — Encounter: Payer: Self-pay | Admitting: Pulmonary Disease

## 2019-07-23 ENCOUNTER — Ambulatory Visit (INDEPENDENT_AMBULATORY_CARE_PROVIDER_SITE_OTHER): Payer: PPO | Admitting: Pulmonary Disease

## 2019-07-23 ENCOUNTER — Other Ambulatory Visit: Payer: Self-pay

## 2019-07-23 VITALS — BP 120/70 | HR 70 | Temp 98.3°F | Ht 70.0 in | Wt 205.2 lb

## 2019-07-23 DIAGNOSIS — R05 Cough: Secondary | ICD-10-CM | POA: Diagnosis not present

## 2019-07-23 DIAGNOSIS — Z23 Encounter for immunization: Secondary | ICD-10-CM

## 2019-07-23 DIAGNOSIS — R059 Cough, unspecified: Secondary | ICD-10-CM

## 2019-07-23 MED ORDER — BUDESONIDE-FORMOTEROL FUMARATE 160-4.5 MCG/ACT IN AERO: 2.0000 | INHALATION_SPRAY | Freq: Two times a day (BID) | RESPIRATORY_TRACT | 0 refills | Status: DC

## 2019-07-23 MED ORDER — PREDNISONE 10 MG PO TABS: ORAL_TABLET | ORAL | 0 refills | Status: DC

## 2019-07-23 MED ORDER — BUDESONIDE-FORMOTEROL FUMARATE 160-4.5 MCG/ACT IN AERO: 2.0000 | INHALATION_SPRAY | Freq: Two times a day (BID) | RESPIRATORY_TRACT | 6 refills | Status: DC

## 2019-07-23 NOTE — Progress Notes (Signed)
Carl Henry    [id number]    Oct 10, 1946  Primary Care Physician:Morrow, Delfino Lovett, NP  Referring Physician: Maximiano Coss, NP [place],  [place] [address]  Chief complaint: Referral for chronic bronchitis  HPI: 73 year old with history of hyperlipidemia, allergies, chronic rhinosinusitis with nasal polyps Complains of recurrent attacks of chronic bronchitis, cough since January 2020.  Also suffered broken ribs from a fall, excessive coughing. In January he was treated with prednisone for 10 days and given an albuterol inhaler.  He states that his symptoms improved while on steroids but recur once he stops the treatment.  Seen in ED on 9/2 for cough, dyspnea with normal chest x-ray.  Labs noted for significant peripheral eosinophilia.  He has history of chronic rhinosinusitis and nasal polyps status post sinus surgery in 2005.  Seen by Dr. Valetta Mole, ENT in 2018  for swollen uvula.  Findings consistent with GERD and PPI therapy recommended.  Pets: No pets Occupation: Used to work for occupational therapy.  Currently retired Exposures: No known exposures.  No mold, hot tub, Jacuzzi Smoking history: Never smoker Travel history: Originally from [place].  No significant recent travel Relevant family history: Grandfather had COPD.  He was a smoker.  Outpatient Encounter Medications as of 07/23/2019  Medication Sig  . aspirin 81 MG tablet Take 81 mg by mouth 3 (three) times a week. Mon, Wed, Fri  . atorvastatin (LIPITOR) 20 MG tablet Take 1 tablet (20 mg total) by mouth daily.  . benzonatate (TESSALON) 200 MG capsule Take 1 capsule (200 mg total) by mouth 2 (two) times daily as needed for cough.  [name] glucosamine-chondroitin 500-400 MG tablet Take 1 tablet by mouth daily.  [name] loratadine (CLARITIN) 10 MG tablet Take 10 mg by mouth daily. 2 times daily  . montelukast (SINGULAIR) 10 MG tablet Take 1 tablet (10 mg total) by mouth at bedtime.  [name]  neomycin-polymyxin-hydrocortisone (CORTISPORIN) OTIC solution Apply 1-2 drops to toe after soaking twice a day  . omeprazole (PRILOSEC) 40 MG capsule take 1 capsule by mouth once daily BEFORE DINNER  . triamcinolone (NASACORT ALLERGY 24HR) 55 MCG/ACT AERO nasal inhaler Place 2 sprays into the nose daily.  . [DISCONTINUED] promethazine-phenylephrine (PROMETHAZINE VC) 6.25-5 MG/5ML SYRP Take 2.5 mLs by mouth every 8 (eight) hours as needed for congestion.   No facility-administered encounter medications on file as of 07/23/2019.     Allergies as of 07/23/2019  . (No Known Allergies)    Past Medical History:  Diagnosis Date  . Allergic rhinitis 02/17/2014  . Allergy   . GERD (gastroesophageal reflux disease)   . Hyperlipidemia   . HYPERLIPIDEMIA 01/11/2007   Qualifier: Diagnosis of  By: Beryle Lathe    . 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 Nasal polyps   . Pure hypercholesterolemia 02/17/2014  . RHINITIS, ALLERGIC 01/11/2007   08/05/2016.  [place].  [name], MD.  Patient presents for evaluation with intermittent acute uvular swelling, he has had 2 episodes last 3 mths.  Etiology unclear, no anatomic abnormalities.  Patient is not on an ACE inhibitor and no hx of angioedema.  We discussed possible etiologies including atypical reflux with irritation and swelling along with heavy snoring with edema    Past Surgical History:  Procedure Laterality Date  . COLONOSCOPY  04/09/13   Two polyps.  Outlaw/[facility].  Repeat 5 years.  [name] NASAL POLYP SURGERY    . NASAL SINUS SURGERY  11/15/2003   x 3.  [name].    Family History  Problem Relation Age of Onset  . Osteoporosis Mother   . Hyperlipidemia Father   . Hypertension Father   . Arthritis Father   . Depression Father   . COPD Sister   . COPD Maternal Grandfather     Social History   Socioeconomic History  . Marital status: Married    Spouse name: Jan  . Number of children: 3  . Years of education: Not on file   . Highest education level: Not on file  Occupational History  . Occupation: Retired    Fish farm manager: RETIRED    Comment: Warden/ranger  Social Needs  . Financial resource strain: Not hard at all  . Food insecurity    Worry: Never true    Inability: Never true  . Transportation needs    Medical: No    Non-medical: No  Tobacco Use  . Smoking status: Never Smoker  . Smokeless tobacco: Never Used  Substance and Sexual Activity  . Alcohol use: No    Alcohol/week: 0.0 standard drinks  . Drug use: No  . Sexual activity: Yes    Partners: Female    Birth control/protection: Post-menopausal  Lifestyle  . Physical activity    Days per week: 0 days    Minutes per session: 0 min  . Stress: Not at all  Relationships  . Social Herbalist on phone: Three times a week    Gets together: Twice a week    Attends religious service: 1 to 4 times per year    Active member of club or organization: No    Attends meetings of clubs or organizations: Never    Relationship status: Married  . Intimate partner violence    Fear of current or ex partner: No    Emotionally abused: No    Physically abused: No    Forced sexual activity: No  Other Topics Concern  . Not on file  Social History Narrative   Marital status: married x 46 years.      Children: 3 children (twin sons); 6 grandchildren.      Lives: with wife, father.      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        Teacher x 5 years.      Tobacco: none      Alcohol: none currently; stopped several years ago.       Exercise: walking daily; goes to [employer] five days per week in 2018.         Seatbelt: 100%      ADLs: independent with all ADLs; drives; no assistant devices for ambulation.      Advance Directives: +LIVING WILL; FULL CODE; no prolonged measures.          Review of systems: Review of Systems  Constitutional: Negative for fever and chills.   HENT: Negative.   Eyes: Negative for blurred vision.  Respiratory: as per HPI  Cardiovascular: Negative for chest pain and palpitations.  Gastrointestinal: Negative for vomiting, diarrhea, blood per rectum. Genitourinary: Negative for dysuria, urgency, frequency and hematuria.  Musculoskeletal: Negative for myalgias, back pain and joint pain.  Skin: Negative for itching and rash.  Neurological: Negative for dizziness, tremors, focal weakness, seizures and loss of consciousness.  Endo/Heme/Allergies: Negative for environmental allergies.  Psychiatric/Behavioral: Negative for depression, suicidal ideas and hallucinations.  All other systems reviewed and are negative.  Physical Exam: Blood pressure 120/70, pulse 70,  temperature 98.3 F (36.8 C), temperature source Temporal, height 5\' 10"  (1.778 m), weight 205 lb 3.2 oz (93.1 kg), SpO2 96 %. Gen:      No acute distress HEENT:  EOMI, sclera anicteric Neck:     No masses; no thyromegaly Lungs:    Scattered expiratory wheeze CV:         Regular rate and rhythm; no murmurs Abd:      + bowel sounds; soft, non-tender; no palpable masses, no distension Ext:    No edema; adequate peripheral perfusion Skin:      Warm and dry; no rash Neuro: alert and oriented x 3 Psych: normal mood and affect  Data Reviewed: Imaging: CT chest 12/25/2018- no evidence of interstitial lung disease.  Minimal bibasilar atelectasis Chest x-ray 07/15/19- minimal bibasilar opacities.  I have reviewed the images personally.  Labs: WBC 07/16/2019-WBC 7.5, eos 7%, absolute eosinophil count 525 SARS-CoV-2 04/26/2019-negative  Assessment:  Chronic cough, bronchitis Likely has asthma, reactive airway disease given history of sinus issues, GERD, elevated peripheral eosinophils Will check respiratory allergy profile and pulmonary function test Give another prednisone taper starting at 40 mg.  Reduce dose by 10 mg every 4 days Start Symbicort inhaler  Chronic sinusitis  Continue steroid nasal spray, Singulair and Claritin  Acid reflux Continue Prilosec.  Increase to 20 mg twice daily for 2 weeks.  Plan/Recommendations: - Pred taper, start Symbicort - Continue albuterol as needed - Steroid nasal spray, Singulair, Claritin - Prilosec - Check respiratory allergy profile, PFTs  Marshell Garfinkel MD  Pulmonary and Critical Care 07/23/2019, 11:54 AM  CC: Maximiano Coss, NP

## 2019-07-23 NOTE — Patient Instructions (Addendum)
We will check a blood allergy profile today Prednisone taper starting at 40 mg.  Reduce dose by 10 mg every 4 days We will start you on Symbicort 160 inhaler.  Use 2 puffs twice daily Continue with the steroid nasal spray, Singulair and Claritin Continue Prilosec for acid reflux.  Increase dose to twice daily for 2 weeks  Schedule pulmonary function test Follow-up in 2 to 4 weeks after PFTs.

## 2019-07-24 LAB — RESPIRATORY ALLERGY PROFILE REGION II ~~LOC~~

## 2019-07-24 LAB — INTERPRETATION:

## 2019-07-25 ENCOUNTER — Other Ambulatory Visit: Payer: Self-pay

## 2019-07-25 ENCOUNTER — Encounter: Payer: Self-pay | Admitting: Interventional Cardiology

## 2019-07-25 ENCOUNTER — Ambulatory Visit: Payer: PPO | Admitting: Interventional Cardiology

## 2019-07-25 VITALS — BP 124/72 | HR 83 | Ht 70.0 in | Wt 207.0 lb

## 2019-07-25 DIAGNOSIS — I700 Atherosclerosis of aorta: Secondary | ICD-10-CM

## 2019-07-25 DIAGNOSIS — R072 Precordial pain: Secondary | ICD-10-CM

## 2019-07-25 DIAGNOSIS — R0609 Other forms of dyspnea: Secondary | ICD-10-CM

## 2019-07-25 DIAGNOSIS — R0600 Dyspnea, unspecified: Secondary | ICD-10-CM

## 2019-07-25 DIAGNOSIS — E782 Mixed hyperlipidemia: Secondary | ICD-10-CM | POA: Diagnosis not present

## 2019-07-25 DIAGNOSIS — R0789 Other chest pain: Secondary | ICD-10-CM | POA: Diagnosis not present

## 2019-07-25 MED ORDER — METOPROLOL TARTRATE 50 MG PO TABS: ORAL_TABLET | ORAL | 0 refills | Status: DC

## 2019-07-25 NOTE — Patient Instructions (Addendum)
Medication Instructions:  Your physician recommends that you continue on your current medications as directed. Please refer to the Current Medication list given to you today.  If you need a refill on your cardiac medications before your next appointment, please call your pharmacy.   Lab work: None Ordered  If you have labs (blood work) drawn today and your tests are completely normal, you will receive your results only by: [name] MyChart Message (if you have MyChart) OR . A paper copy in the mail If you have any lab test that is abnormal or we need to change your treatment, we will call you to review the results.  Testing/Procedures: Your physician has requested that you have an echocardiogram. Echocardiography is a painless test that uses sound waves to create images of your heart. It provides your doctor with information about the size and shape of your heart and how well your heart's chambers and valves are working. This procedure takes approximately one hour. There are no restrictions for this procedure.  Your physician has requested that you have cardiac CT. Cardiac computed tomography (CT) is a painless test that uses an x-ray machine to take clear, detailed pictures of your heart. For further information please visit [facility]uge[facility]iesta[facility]tn. Please follow instruction sheet as given.    Follow-Up: . Based on test results  Any Other Special Instructions Will Be Listed Below (If Applicable).  CARDIAC CT INSTRUCTIONS  Your cardiac CT will be scheduled at one of the below locations:   [facility] [address] [place], [place] [address] [phone]   If scheduled at [facility], please arrive at the [facility] main entrance of [facility] 30-45 minutes prior to test start time. Proceed to the [facility] Radiology Department (first floor) to check-in and test prep.  Please follow these instructions carefully (unless otherwise directed):   On the Night  Before the Test: . Be sure to Drink plenty of water. . Do not consume any caffeinated/decaffeinated beverages or chocolate 12 hours prior to your test. . Do not take any antihistamines 12 hours prior to your test.   On the Day of the Test: . Drink plenty of water. Do not drink any water within one hour of the test. . Do not eat any food 4 hours prior to the test. . You may take your regular medications prior to the test.  . Take metoprolol (Lopressor) 50 mg tablet two hours prior to test. . DO NOT use your Symbicort the morning of your procedure       After the Test: . Drink plenty of water. . After receiving IV contrast, you may experience a mild flushed feeling. This is normal. . On occasion, you may experience a mild rash up to 24 hours after the test. This is not dangerous. If this occurs, you can take Benadryl 25 mg and increase your fluid intake. . If you experience trouble breathing, this can be serious. If it is severe call 911 IMMEDIATELY. If it is mild, please call our office.  Please contact the cardiac imaging nurse navigator should you have any questions/concerns Marchia Bond, RN Navigator Cardiac Imaging [facility] [phone] Office  [phone] Cell    Echocardiogram An echocardiogram is a procedure that uses painless sound waves (ultrasound) to produce an image of the heart. Images from an echocardiogram can provide important information about:  Signs of coronary artery disease (CAD).  Aneurysm detection. An aneurysm is a weak or damaged part  of an artery wall that bulges out from the normal force of blood pumping through the body.  Heart size and shape. Changes in the size or shape of the heart can be associated with certain conditions, including heart failure, aneurysm, and CAD.  Heart muscle function.  Heart valve function.  Signs of a past heart attack.  Fluid buildup around the heart.  Thickening of the heart muscle.  A  tumor or infectious growth around the heart valves. Tell a health care provider about:  Any allergies you have.  All medicines you are taking, including vitamins, herbs, eye drops, creams, and over-the-counter medicines.  Any blood disorders you have.  Any surgeries you have had.  Any medical conditions you have.  Whether you are pregnant or may be pregnant. What are the risks? Generally, this is a safe procedure. However, problems may occur, including:  Allergic reaction to dye (contrast) that may be used during the procedure. What happens before the procedure? No specific preparation is needed. You may eat and drink normally. What happens during the procedure?   An IV tube may be inserted into one of your veins.  You may receive contrast through this tube. A contrast is an injection that improves the quality of the pictures from your heart.  A gel will be applied to your chest.  A wand-like tool (transducer) will be moved over your chest. The gel will help to transmit the sound waves from the transducer.  The sound waves will harmlessly bounce off of your heart to allow the heart images to be captured in real-time motion. The images will be recorded on a computer. The procedure may vary among health care providers and hospitals. What happens after the procedure?  You may return to your normal, everyday life, including diet, activities, and medicines, unless your health care provider tells you not to do that. Summary  An echocardiogram is a procedure that uses painless sound waves (ultrasound) to produce an image of the heart.  Images from an echocardiogram can provide important information about the size and shape of your heart, heart muscle function, heart valve function, and fluid buildup around your heart.  You do not need to do anything to prepare before this procedure. You may eat and drink normally.  After the echocardiogram is completed, you may return to your  normal, everyday life, unless your health care provider tells you not to do that. This information is not intended to replace advice given to you by your health care provider. Make sure you discuss any questions you have with your health care provider. Document Released: 10/28/2000 Document Revised: 02/21/2019 Document Reviewed: 12/03/2016 Elsevier Patient Education  2020 [employer].

## 2019-07-25 NOTE — Progress Notes (Signed)
Cardiology Office Note   Date:  07/25/2019   ID:  Carl Henry, Carl Henry [birth year], MRN [id number]  PCP:  Carl Coss, NP    No chief complaint on file.  DOE  [facility] Readings from Last 3 Encounters:  07/25/19 207 lb (93.9 kg)  07/23/19 205 lb 3.2 oz (93.1 kg)  07/17/19 200 lb (90.7 kg)       History of Present Illness: Carl Henry is a 73 y.o. male who is being seen today for the evaluation of DOE at the request of Carl Coss, NP.  Problems started when he had bronchitis in Jan 2020.  Sx were better, but cough persisted.  In Feb 2020, fall break a rib.  He had a lot of pain with coughing.    Sx got better in June, but then by the end of the month, the cough returned.  He was negative for COVID in July.  At the end of August, he saw his PMD again. Weakness increased, and he made a trip to the ER.   He wants to make sure that his [facility] is not from his heart rather than his lung issues.   He has had some chest pressure, unclear if this was from the coughing.  He had somepressure in his chest while walking up a hill.  Coughing would get worse.  Saw pulmonary on 07/23/2019: "Pred taper, start Symbicort - Continue albuterol as needed - Steroid nasal spray, Singulair, Claritin - Prilosec - Check respiratory allergy profile, PFTs"  Currently, he is feeling better.  He is trying to be more active to maintain his stamina.   Denies : Dizziness. Leg edema. Nitroglycerin use. Orthopnea. Palpitations. Paroxysmal nocturnal dyspnea. Syncope.      Past Medical History:  Diagnosis Date  . Allergic rhinitis 02/17/2014  . Allergy   . GERD (gastroesophageal reflux disease)   . Hyperlipidemia   . HYPERLIPIDEMIA 01/11/2007   Qualifier: Diagnosis of  By: Beryle Lathe    . 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 Nasal polyps   . Pure hypercholesterolemia 02/17/2014  . RHINITIS, ALLERGIC 01/11/2007   08/05/2016.  [place].  [name], MD.  Patient presents  for evaluation with intermittent acute uvular swelling, he has had 2 episodes last 3 mths.  Etiology unclear, no anatomic abnormalities.  Patient is not on an ACE inhibitor and no hx of angioedema.  We discussed possible etiologies including atypical reflux with irritation and swelling along with heavy snoring with edema    Past Surgical History:  Procedure Laterality Date  . COLONOSCOPY  04/09/13   Two polyps.  Outlaw/[facility].  Repeat 5 years.  [name] NASAL POLYP SURGERY    . NASAL SINUS SURGERY  11/15/2003   x 3.  [name].     Current Outpatient Medications  Medication Sig Dispense Refill  . aspirin 81 MG tablet Take 81 mg by mouth 3 (three) times a week. Mon, Wed, Fri    . atorvastatin (LIPITOR) 20 MG tablet Take 1 tablet (20 mg total) by mouth daily. 90 tablet 1  . benzonatate (TESSALON) 200 MG capsule Take 1 capsule (200 mg total) by mouth 2 (two) times daily as needed for cough. 20 capsule 0  . budesonide-formoterol (SYMBICORT) 160-4.5 MCG/ACT inhaler Inhale 2 puffs into the lungs 2 (two) times daily. 1 Inhaler 6  . glucosamine-chondroitin 500-400 MG tablet Take 1 tablet by mouth daily.    [name] loratadine (CLARITIN) 10 MG tablet Take 10 mg by mouth daily. 2 times  daily    . montelukast (SINGULAIR) 10 MG tablet Take 1 tablet (10 mg total) by mouth at bedtime. 90 tablet 1  . neomycin-polymyxin-hydrocortisone (CORTISPORIN) OTIC solution Apply 1-2 drops to toe after soaking twice a day 10 mL 0  . omeprazole (PRILOSEC) 40 MG capsule take 1 capsule by mouth once daily BEFORE DINNER 30 capsule 0  . predniSONE (DELTASONE) 10 MG tablet Take 4 tabs once daily for 4 days, then 3 tabs for 4 days, then 2 tabs for 4 days, then 1 tab for 4 days, then stop. 40 tablet 0  . triamcinolone (NASACORT ALLERGY 24HR) 55 MCG/ACT AERO nasal inhaler Place 2 sprays into the nose daily.    . metoprolol tartrate (LOPRESSOR) 50 MG tablet Take 1 tablet 2 hours prior to Cardiac CT 1 tablet 0   No current facility-administered  medications for this visit.     Allergies:   Patient has no known allergies.    Social History:  The patient  reports that he has never smoked. He has never used smokeless tobacco. He reports that he does not drink alcohol or use drugs.   Family History:  The patient's family history includes Arthritis in his father; COPD in his maternal grandfather and sister; Depression in his father; Hyperlipidemia in his father; Hypertension in his father; Osteoporosis in his mother.    ROS:  Please see the history of present illness.   Otherwise, review of systems are positive for cough, DOE.   All other systems are reviewed and negative.    PHYSICAL EXAM: VS:  BP 124/72   Pulse 83   Ht 5\' 10"  (1.778 m)   Wt 207 lb (93.9 kg)   SpO2 93%   BMI 29.70 kg/m  , BMI Body mass index is 29.7 kg/m. GEN: Well nourished, well developed, in no acute distress  HEENT: normal  Neck: no JVD, carotid bruits, or masses Cardiac: RRR; no murmurs, rubs, or gallops,no edema  Respiratory:  clear to auscultation bilaterally, normal work of breathing GI: soft, nontender, nondistended, + BS MS: no deformity or atrophy ; 2+ PT pulses bilaterally Skin: warm and dry, no rash Neuro:  Strength and sensation are intact Psych: euthymic mood, full affect   EKG:   The ekg ordered today demonstrates NSR, rSR', no ST changes   Recent Labs: 04/26/2019: ALT 26 07/16/2019: BNP 30.1; BUN 11; Creatinine, Ser 1.00; Hemoglobin 15.1; Platelets 277; Potassium 4.3; Sodium 144   Lipid Panel    Component Value Date/Time   CHOL 201 (H) 04/26/2019 0847   TRIG 106 04/26/2019 0847   HDL 46 04/26/2019 0847   CHOLHDL 4.4 04/26/2019 0847   CHOLHDL 3.8 04/19/2016 0906   VLDL 15 04/19/2016 0906   LDLCALC 134 (H) 04/26/2019 0847     Other studies Reviewed: Additional studies/ records that were reviewed today with results demonstrating: CT scans and CXR reviewed.   ASSESSMENT AND PLAN:  1. DOE: No pulmonary edema noted on prior  xray, CT scans.  Check echo to look at LV function and valvular function. 2. Chest pressure: Plan CTA coronaries.  This will help with risk stratification.  Hold Symbicort morning of CT scan to help with heart rate. OK for metoprolol 50 mg day of test.  No active wheezing.  3. Hyperlipidemia: Continue atorvastatin.  Aortic atherosclerosis noted on abdominal portion.  Would like to see LDL  Below 100.   Current medicines are reviewed at length with the patient today.  The patient concerns regarding his medicines  were addressed.  The following changes have been made:  No change  Labs/ tests ordered today include:   Orders Placed This Encounter  Procedures  . CT CORONARY MORPH W/CTA COR W/SCORE W/CA W/CM &/OR WO/CM  . CT CORONARY FRACTIONAL FLOW RESERVE DATA PREP  . CT CORONARY FRACTIONAL FLOW RESERVE FLUID ANALYSIS  . ECHOCARDIOGRAM COMPLETE    Recommend 150 minutes/week of aerobic exercise Low fat, low carb, high fiber diet recommended  Disposition:   FU for tests   Signed, Larae Grooms, MD  07/25/2019 12:12 PM    [facility] [place], [place], [place]  [address] Phone: [phone]; Fax: [phone]

## 2019-07-30 ENCOUNTER — Ambulatory Visit (HOSPITAL_COMMUNITY): Payer: PPO | Attending: Cardiology

## 2019-07-30 ENCOUNTER — Other Ambulatory Visit: Payer: Self-pay

## 2019-07-30 DIAGNOSIS — R072 Precordial pain: Secondary | ICD-10-CM | POA: Diagnosis not present

## 2019-07-30 DIAGNOSIS — R0600 Dyspnea, unspecified: Secondary | ICD-10-CM

## 2019-07-30 DIAGNOSIS — R0609 Other forms of dyspnea: Secondary | ICD-10-CM | POA: Diagnosis not present

## 2019-07-30 DIAGNOSIS — R0789 Other chest pain: Secondary | ICD-10-CM | POA: Insufficient documentation

## 2019-08-21 ENCOUNTER — Other Ambulatory Visit: Payer: Self-pay | Admitting: Pulmonary Disease

## 2019-08-22 ENCOUNTER — Telehealth (HOSPITAL_COMMUNITY): Payer: Self-pay | Admitting: Emergency Medicine

## 2019-08-22 NOTE — Telephone Encounter (Signed)
Reaching out to patient to offer assistance regarding upcoming cardiac imaging study; pt verbalizes understanding of appt date/time, parking situation and where to check in, pre-test NPO status and medications ordered, and verified current allergies; name and call back number provided for further questions should they arise Ashton Belote RN Navigator Cardiac Imaging [facility] [phone] office [phone] cell 

## 2019-08-23 ENCOUNTER — Ambulatory Visit (HOSPITAL_COMMUNITY)
Admission: RE | Admit: 2019-08-23 | Discharge: 2019-08-23 | Disposition: A | Discharge status: Home / Self Care | Payer: PPO | Source: Ambulatory Visit | Attending: Interventional Cardiology | Admitting: Interventional Cardiology

## 2019-08-23 ENCOUNTER — Other Ambulatory Visit: Payer: Self-pay

## 2019-08-23 DIAGNOSIS — R072 Precordial pain: Secondary | ICD-10-CM | POA: Insufficient documentation

## 2019-08-23 IMAGING — DX DG KNEE COMPLETE 4+V*L*
4 series · 4 of 4 positions shown · non-contrast
Comparison: 08/10/2010.

CLINICAL DATA: Left knee pain.  No known injury.

EXAM:
LEFT KNEE - COMPLETE 4+ VIEW

[knee ap]
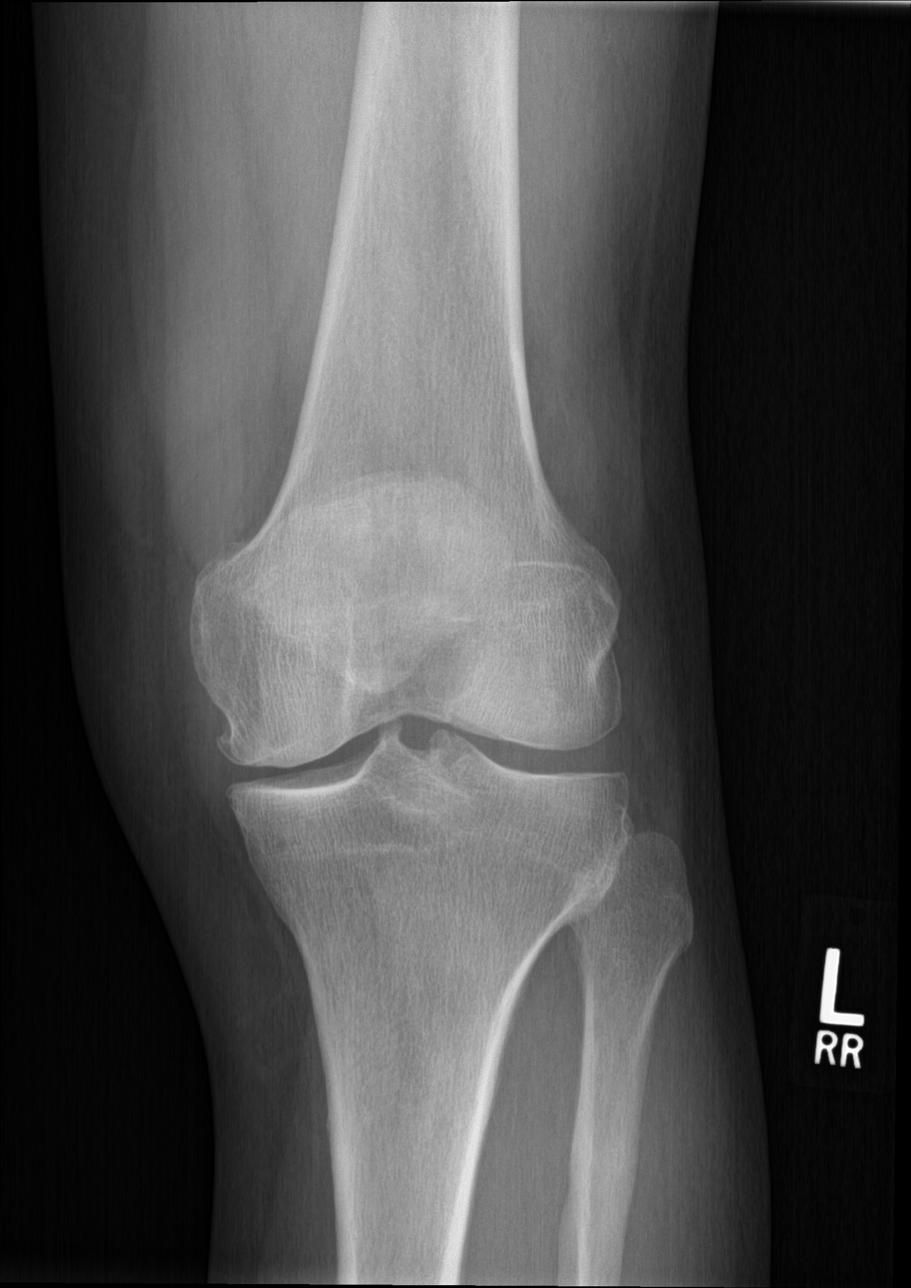

[knee obl (1 of 2)]
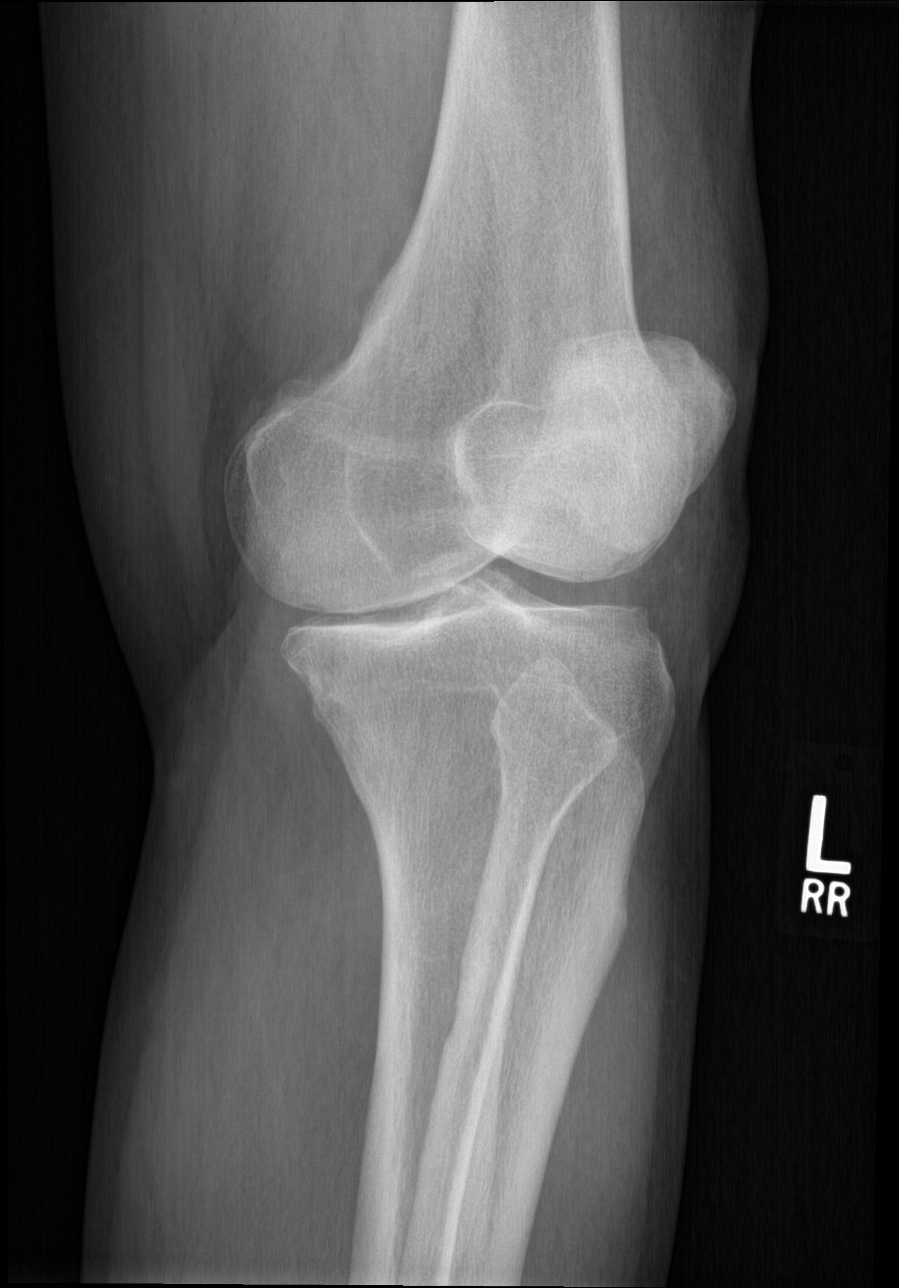

[knee obl (2 of 2)]
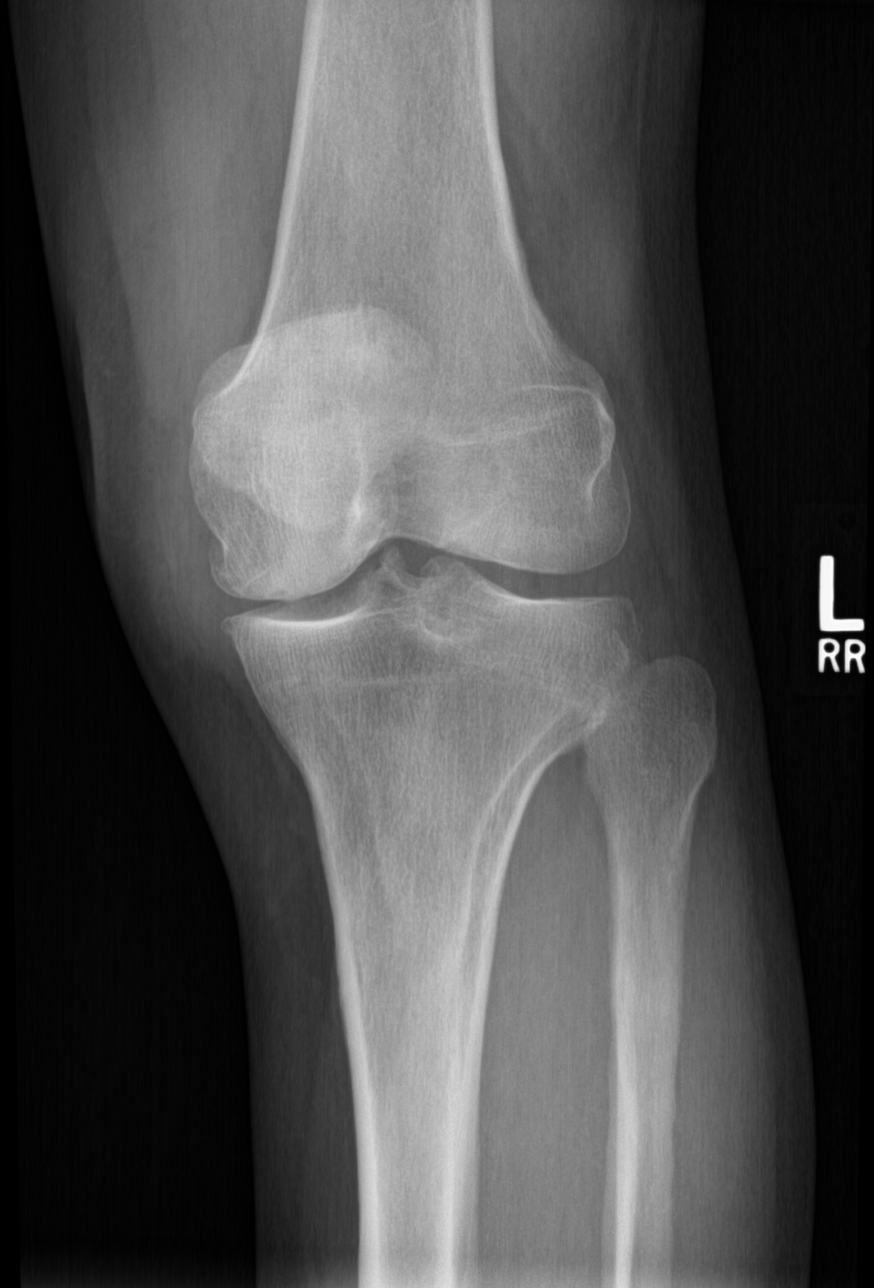

[knee lat]
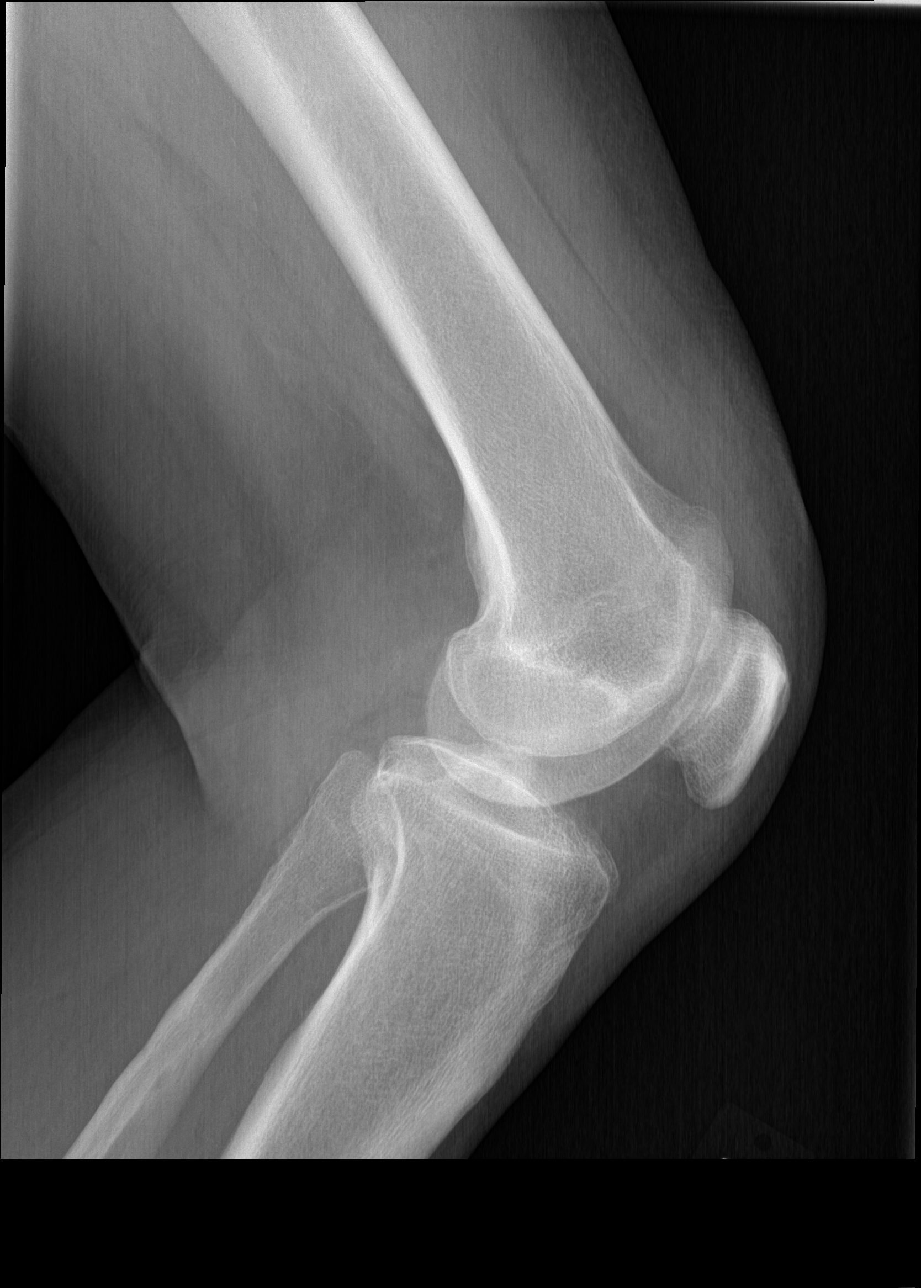

[4 of 4 positions shown; findings below may reference images not displayed]

FINDINGS: No acute bony or joint abnormality identified. No evidence of
fracture or dislocation. Mild soft tissue swelling cannot be
excluded.
IMPRESSION: Mild soft tissue swelling cannot be excluded. Tricompartment
degenerative change. No acute abnormality.

## 2019-08-23 MED ORDER — NITROGLYCERIN 0.4 MG SL SUBL
SUBLINGUAL_TABLET | SUBLINGUAL | Status: AC
  Filled 2019-08-23: qty 2

## 2019-08-23 MED ORDER — NITROGLYCERIN 0.4 MG SL SUBL
0.8000 mg | SUBLINGUAL_TABLET | SUBLINGUAL | Status: DC | PRN
  Filled 2019-08-23: qty 25

## 2019-08-23 MED ORDER — IOHEXOL 350 MG/ML SOLN
80.0000 mL | Freq: Once | INTRAVENOUS | Status: AC | PRN
  Administered 2019-08-23: 80 mL via INTRAVENOUS

## 2019-08-27 ENCOUNTER — Telehealth: Payer: Self-pay

## 2019-08-27 NOTE — Telephone Encounter (Signed)
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  COntinue preventive therapy and Risk factor modification.

## 2019-08-27 NOTE — Telephone Encounter (Signed)
Left message for patient to call back  

## 2019-08-27 NOTE — Telephone Encounter (Signed)
Notes recorded by Frederik Schmidt, RN on 08/27/2019 at 9:11 AM EDT  [email or web address] 10/13  ------

## 2019-09-09 ENCOUNTER — Other Ambulatory Visit (HOSPITAL_COMMUNITY)
Admission: RE | Admit: 2019-09-09 | Discharge: 2019-09-09 | Disposition: A | Discharge status: Home / Self Care | Payer: PPO | Source: Ambulatory Visit | Attending: Pulmonary Disease | Admitting: Pulmonary Disease

## 2019-09-09 DIAGNOSIS — Z20828 Contact with and (suspected) exposure to other viral communicable diseases: Secondary | ICD-10-CM | POA: Diagnosis not present

## 2019-09-09 DIAGNOSIS — Z01812 Encounter for preprocedural laboratory examination: Secondary | ICD-10-CM | POA: Insufficient documentation

## 2019-09-10 LAB — NOVEL CORONAVIRUS, NAA (HOSP ORDER, SEND-OUT TO REF LAB; TAT 18-24 HRS): SARS-CoV-2, NAA: NOT DETECTED

## 2019-09-12 ENCOUNTER — Other Ambulatory Visit: Payer: Self-pay

## 2019-09-12 ENCOUNTER — Telehealth: Payer: Self-pay | Admitting: Pulmonary Disease

## 2019-09-12 ENCOUNTER — Ambulatory Visit (INDEPENDENT_AMBULATORY_CARE_PROVIDER_SITE_OTHER): Payer: PPO | Admitting: Pulmonary Disease

## 2019-09-12 DIAGNOSIS — R05 Cough: Secondary | ICD-10-CM

## 2019-09-12 DIAGNOSIS — R059 Cough, unspecified: Secondary | ICD-10-CM

## 2019-09-12 LAB — PULMONARY FUNCTION TEST
DL/VA % pred: 94 %
DL/VA: 3.8 ml/min/mmHg/L
DLCO cor % pred: 104 %
DLCO cor: 27.75 ml/min/mmHg
DLCO unc % pred: 105 %
DLCO unc: 28.13 ml/min/mmHg
FEF 25-75 Post: 3.62 L/sec
FEF 25-75 Pre: 2.41 L/sec
FEF2575-%Change-Post: 49 %
FEF2575-%Pred-Post: 143 %
FEF2575-%Pred-Pre: 95 %
FEV1-%Change-Post: 11 %
FEV1-%Pred-Post: 106 %
FEV1-%Pred-Pre: 96 %
FEV1-Post: 3.58 L
FEV1-Pre: 3.22 L
FEV1FVC-%Change-Post: 18 %
FEV1FVC-%Pred-Pre: 97 %
FEV6-%Change-Post: -6 %
FEV6-%Pred-Post: 96 %
FEV6-%Pred-Pre: 103 %
FEV6-Post: 4.16 L
FEV6-Pre: 4.44 L
FEV6FVC-%Change-Post: 0 %
FEV6FVC-%Pred-Post: 105 %
FEV6FVC-%Pred-Pre: 105 %
FVC-%Change-Post: -6 %
FVC-%Pred-Post: 92 %
FVC-%Pred-Pre: 98 %
FVC-Post: 4.21 L
FVC-Pre: 4.49 L
Post FEV1/FVC ratio: 85 %
Post FEV6/FVC ratio: 100 %
Pre FEV1/FVC ratio: 72 %
Pre FEV6/FVC Ratio: 99 %
RV % pred: 114 %
RV: 2.89 L
TLC % pred: 101 %
TLC: 7.34 L

## 2019-09-12 MED ORDER — BUDESONIDE-FORMOTEROL FUMARATE 160-4.5 MCG/ACT IN AERO: 2.0000 | INHALATION_SPRAY | Freq: Two times a day (BID) | RESPIRATORY_TRACT | 0 refills | Status: DC

## 2019-09-12 NOTE — Telephone Encounter (Signed)
Carl Henry CMA did schedule review yesterday and patient requesting an additional Symbicort 160 to pick up at his 09/12/19 PFT  1 sample obtained and documented per protocol  Nothing further needed; will sign off

## 2019-09-12 NOTE — Progress Notes (Signed)
PFT done today. 

## 2019-09-19 ENCOUNTER — Ambulatory Visit: Payer: PPO | Admitting: Pulmonary Disease

## 2019-09-19 ENCOUNTER — Encounter: Payer: Self-pay | Admitting: Pulmonary Disease

## 2019-09-19 ENCOUNTER — Other Ambulatory Visit: Payer: Self-pay

## 2019-09-19 VITALS — BP 128/60 | HR 60 | Ht 71.0 in | Wt 212.8 lb

## 2019-09-19 DIAGNOSIS — J4540 Moderate persistent asthma, uncomplicated: Secondary | ICD-10-CM | POA: Insufficient documentation

## 2019-09-19 NOTE — Patient Instructions (Signed)
I have reviewed your pulmonary function test which indicate mild-moderate asthma Continue Symbicort inhaler Follow-up in 6 months

## 2019-09-19 NOTE — Progress Notes (Signed)
Carl Henry    [id number]    10/27/1946  Primary Care Physician:Morrow, Delfino Lovett, NP  Referring Physician: Maximiano Coss, NP [name],  [place] [address]  Chief complaint: Follow-up for asthma  HPI: 73 year old with history of hyperlipidemia, allergies, chronic rhinosinusitis with nasal polyps Complains of recurrent attacks of chronic bronchitis, cough since January 2020.  Also suffered broken ribs from a fall, excessive coughing. In January he was treated with prednisone for 10 days and given an albuterol inhaler.  He states that his symptoms improved while on steroids but recur once he stops the treatment.  Seen in ED on 9/2 for cough, dyspnea with normal chest x-ray.  Labs noted for significant peripheral eosinophilia.  He has history of chronic rhinosinusitis and nasal polyps status post sinus surgery in 2005.  Seen by Dr. Valetta Mole, ENT in 2018  for swollen uvula.  Findings consistent with GERD and PPI therapy recommended.  Pets: No pets Occupation: Used to work for occupational therapy.  Currently retired Exposures: No known exposures.  No mold, hot tub, Jacuzzi Smoking history: Never smoker Travel history: Originally from [place].  No significant recent travel Relevant family history: Grandfather had COPD.  He was a smoker.  Interim history:  Here for review of PFTs.  Started on Symbicort at last visit.  States that overall his breathing has improved Still has occasional dyspnea on exertion.  Outpatient Encounter Medications as of 09/19/2019  Medication Sig  . aspirin 81 MG tablet Take 81 mg by mouth 3 (three) times a week. Mon, Wed, Fri  . atorvastatin (LIPITOR) 20 MG tablet Take 1 tablet (20 mg total) by mouth daily.  . benzonatate (TESSALON) 200 MG capsule Take 1 capsule (200 mg total) by mouth 2 (two) times daily as needed for cough.  . budesonide-formoterol (SYMBICORT) 160-4.5 MCG/ACT inhaler Inhale 2 puffs into the lungs 2 (two) times daily.   [name] glucosamine-chondroitin 500-400 MG tablet Take 1 tablet by mouth daily.  [name] loratadine (CLARITIN) 10 MG tablet Take 10 mg by mouth daily. 2 times daily  . montelukast (SINGULAIR) 10 MG tablet Take 1 tablet (10 mg total) by mouth at bedtime.  [name] neomycin-polymyxin-hydrocortisone (CORTISPORIN) OTIC solution Apply 1-2 drops to toe after soaking twice a day  . omeprazole (PRILOSEC) 40 MG capsule take 1 capsule by mouth once daily BEFORE DINNER  . triamcinolone (NASACORT ALLERGY 24HR) 55 MCG/ACT AERO nasal inhaler Place 2 sprays into the nose daily.  . [DISCONTINUED] budesonide-formoterol (SYMBICORT) 160-4.5 MCG/ACT inhaler Inhale 2 puffs into the lungs 2 (two) times daily.  . [DISCONTINUED] metoprolol tartrate (LOPRESSOR) 50 MG tablet Take 1 tablet 2 hours prior to Cardiac CT  . [DISCONTINUED] predniSONE (DELTASONE) 10 MG tablet Take 4 tabs once daily for 4 days, then 3 tabs for 4 days, then 2 tabs for 4 days, then 1 tab for 4 days, then stop.   No facility-administered encounter medications on file as of 09/19/2019.     Allergies as of 09/19/2019  . (No Known Allergies)    Past Medical History:  Diagnosis Date  . Allergic rhinitis 02/17/2014  . Allergy   . GERD (gastroesophageal reflux disease)   . Hyperlipidemia   . HYPERLIPIDEMIA 01/11/2007   Qualifier: Diagnosis of  By: Beryle Lathe    . 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 Nasal polyps   . Pure hypercholesterolemia 02/17/2014  . RHINITIS, ALLERGIC 01/11/2007   08/05/2016.  [place].  [name], MD.  Patient presents  for evaluation with intermittent acute uvular swelling, he has had 2 episodes last 3 mths.  Etiology unclear, no anatomic abnormalities.  Patient is not on an ACE inhibitor and no hx of angioedema.  We discussed possible etiologies including atypical reflux with irritation and swelling along with heavy snoring with edema    Past Surgical History:  Procedure Laterality Date  . COLONOSCOPY  04/09/13    Two polyps.  Outlaw/[facility].  Repeat 5 years.  [name] NASAL POLYP SURGERY    . NASAL SINUS SURGERY  11/15/2003   x 3.  [name].    Family History  Problem Relation Age of Onset  . Osteoporosis Mother   . Hyperlipidemia Father   . Hypertension Father   . Arthritis Father   . Depression Father   . COPD Sister   . COPD Maternal Grandfather     Social History   Socioeconomic History  . Marital status: Married    Spouse name: Jan  . Number of children: 3  . Years of education: Not on file  . Highest education level: Not on file  Occupational History  . Occupation: Retired    Fish farm manager: RETIRED    Comment: Warden/ranger  Social Needs  . Financial resource strain: Not hard at all  . Food insecurity    Worry: Never true    Inability: Never true  . Transportation needs    Medical: No    Non-medical: No  Tobacco Use  . Smoking status: Never Smoker  . Smokeless tobacco: Never Used  Substance and Sexual Activity  . Alcohol use: No    Alcohol/week: 0.0 standard drinks  . Drug use: No  . Sexual activity: Yes    Partners: Female    Birth control/protection: Post-menopausal  Lifestyle  . Physical activity    Days per week: 0 days    Minutes per session: 0 min  . Stress: Not at all  Relationships  . Social Herbalist on phone: Three times a week    Gets together: Twice a week    Attends religious service: 1 to 4 times per year    Active member of club or organization: No    Attends meetings of clubs or organizations: Never    Relationship status: Married  . Intimate partner violence    Fear of current or ex partner: No    Emotionally abused: No    Physically abused: No    Forced sexual activity: No  Other Topics Concern  . Not on file  Social History Narrative   Marital status: married x 46 years.      Children: 3 children (twin sons); 6 grandchildren.      Lives: with wife, father.      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        Teacher x 5 years.      Tobacco: none      Alcohol: none currently; stopped several years ago.       Exercise: walking daily; goes to [employer] five days per week in 2018.         Seatbelt: 100%      ADLs: independent with all ADLs; drives; no assistant devices for ambulation.      Advance Directives: +LIVING WILL; FULL CODE; no prolonged measures.          Review of systems: Review of Systems  Constitutional: Negative for fever and chills.  HENT: Negative.  Eyes: Negative for blurred vision.  Respiratory: as per HPI  Cardiovascular: Negative for chest pain and palpitations.  Gastrointestinal: Negative for vomiting, diarrhea, blood per rectum. Genitourinary: Negative for dysuria, urgency, frequency and hematuria.  Musculoskeletal: Negative for myalgias, back pain and joint pain.  Skin: Negative for itching and rash.  Neurological: Negative for dizziness, tremors, focal weakness, seizures and loss of consciousness.  Endo/Heme/Allergies: Negative for environmental allergies.  Psychiatric/Behavioral: Negative for depression, suicidal ideas and hallucinations.  All other systems reviewed and are negative.  Physical Exam: Blood pressure 120/70, pulse 70, temperature 98.3 F (36.8 C), temperature source Temporal, height 5\' 10"  (1.778 m), weight 205 lb 3.2 oz (93.1 kg), SpO2 96 %. Gen:      No acute distress HEENT:  EOMI, sclera anicteric Neck:     No masses; no thyromegaly Lungs:    Scattered expiratory wheeze CV:         Regular rate and rhythm; no murmurs Abd:      + bowel sounds; soft, non-tender; no palpable masses, no distension Ext:    No edema; adequate peripheral perfusion Skin:      Warm and dry; no rash Neuro: alert and oriented x 3 Psych: normal mood and affect  Data Reviewed: Imaging: CT chest 12/25/2018- no evidence of interstitial lung disease.  Minimal bibasilar atelectasis Chest x-ray 07/15/19- minimal bibasilar  opacities.  I have reviewed the images personally.  PFTs: 09/12/19-FVC 4.21 [92%], FEV1 3.58 [106%), F/F 85, TLC 7.34 [101%], DLCO 28.13 [105%) Minimal obstruction  Labs: WBC 07/16/2019-WBC 7.5, eos 7%, absolute eosinophil count 525 Respiratory allergy profile 07/23/2019-IgE 45, RAST panel negative SARS-CoV-2 04/26/2019-negative  Assessment:  Moderate persistent asthma Likely has asthma, reactive airway disease given history of sinus issues, GERD, elevated peripheral eosinophils PFTs reviewed with no overt obstruction but curvature to flow loop in FEV1 and mid flow rates suggestive of reactive airways Doing well on Symbicort inhaler.  Continue the same  Chronic sinusitis Continue steroid nasal spray, Singulair and Claritin  Acid reflux Continue Prilosec  Plan/Recommendations: - Symbicort - Continue albuterol as needed - Steroid nasal spray, Singulair, Claritin  Marshell Garfinkel MD [facility] 09/19/2019, 10:40 AM  CC: Maximiano Coss, NP

## 2019-09-19 NOTE — Telephone Encounter (Signed)
Notes recorded by Drue Novel I, RN on 09/09/2019 at 11:06 AM EDT  The patient has been notified of the result and verbalized understanding. All questions (if any) were answered.  Cleon Gustin, RN 09/09/2019 11:06 AM

## 2019-09-26 NOTE — Telephone Encounter (Signed)
Assessment:  Moderate persistent asthma Likely has asthma, reactive airway disease given history of sinus issues, GERD, elevated peripheral eosinophils PFTs reviewed with no overt obstruction but curvature to flow loop in FEV1 and mid flow rates suggestive of reactive airways Doing well on Symbicort inhaler.  Continue the same  Chronic sinusitis Continue steroid nasal spray, Singulair and Claritin  Acid reflux Continue Prilosec  Plan/Recommendations: - Symbicort - Continue albuterol as needed - Steroid nasal spray, Singulair, Claritin  Marshell Garfinkel MD  [facility] 09/19/2019, 10:40 AM    Refills sent Patient is aware

## 2019-10-08 ENCOUNTER — Encounter: Payer: Self-pay | Admitting: Registered Nurse

## 2019-11-08 ENCOUNTER — Encounter: Payer: Self-pay | Admitting: Registered Nurse

## 2019-11-11 ENCOUNTER — Other Ambulatory Visit: Payer: Self-pay | Admitting: *Deleted

## 2019-11-11 MED ORDER — ATORVASTATIN CALCIUM 20 MG PO TABS: 20.0000 mg | ORAL_TABLET | Freq: Every day | ORAL | 1 refills | Status: DC

## 2019-12-13 ENCOUNTER — Other Ambulatory Visit: Payer: Self-pay

## 2019-12-13 ENCOUNTER — Ambulatory Visit (INDEPENDENT_AMBULATORY_CARE_PROVIDER_SITE_OTHER): Payer: PPO

## 2019-12-13 ENCOUNTER — Encounter: Payer: Self-pay | Admitting: Registered Nurse

## 2019-12-13 ENCOUNTER — Ambulatory Visit (INDEPENDENT_AMBULATORY_CARE_PROVIDER_SITE_OTHER): Payer: PPO | Admitting: Registered Nurse

## 2019-12-13 VITALS — BP 117/72 | HR 81 | Temp 97.2°F | Ht 71.0 in | Wt 203.4 lb

## 2019-12-13 DIAGNOSIS — Z23 Encounter for immunization: Secondary | ICD-10-CM | POA: Diagnosis not present

## 2019-12-13 DIAGNOSIS — Z0001 Encounter for general adult medical examination with abnormal findings: Secondary | ICD-10-CM

## 2019-12-13 DIAGNOSIS — Z13220 Encounter for screening for lipoid disorders: Secondary | ICD-10-CM | POA: Diagnosis not present

## 2019-12-13 DIAGNOSIS — Z13228 Encounter for screening for other metabolic disorders: Secondary | ICD-10-CM | POA: Diagnosis not present

## 2019-12-13 DIAGNOSIS — Z130 Encounter for screening for diseases of the blood and blood-forming organs and certain disorders involving the immune mechanism: Secondary | ICD-10-CM | POA: Diagnosis not present

## 2019-12-13 DIAGNOSIS — R1032 Left lower quadrant pain: Secondary | ICD-10-CM | POA: Diagnosis not present

## 2019-12-13 DIAGNOSIS — Z1329 Encounter for screening for other suspected endocrine disorder: Secondary | ICD-10-CM | POA: Diagnosis not present

## 2019-12-13 DIAGNOSIS — S99922A Unspecified injury of left foot, initial encounter: Secondary | ICD-10-CM

## 2019-12-13 DIAGNOSIS — M19012 Primary osteoarthritis, left shoulder: Secondary | ICD-10-CM

## 2019-12-13 DIAGNOSIS — Z0000 Encounter for general adult medical examination without abnormal findings: Secondary | ICD-10-CM

## 2019-12-13 MED ORDER — MUPIROCIN 2 % EX OINT: 1.0000 "application " | TOPICAL_OINTMENT | Freq: Two times a day (BID) | CUTANEOUS | 0 refills | Status: DC

## 2019-12-13 MED ORDER — MELOXICAM 7.5 MG PO TABS: 7.5000 mg | ORAL_TABLET | Freq: Every day | ORAL | 2 refills | Status: DC

## 2019-12-13 NOTE — Patient Instructions (Addendum)
Mr. Guijarro -  Doristine Devoid to see you today. No major concerns on exam, but I do want to get that abdominal pain checked out. In addition to today's labs, I'm going to order a CT scan on your abdomen. [facility] will be giving you a call in the coming week or two to get that scheduled. They'll send the results over to me as soon as they're available.  I'm going to send in a prescription for Meloxicam to help with arthritis pain. On the "bad days" for pain, you can take one - don't take ibuprofen those days. You can still use the topical voltaren, as together these may work well. Additionally, feel free to reach out if your shoulder is getting acutely worse or any further concerns arise.  Below, please find the COVID-19 Vaccination information:  COVID-19 Vaccine Information can be found at: ShippingScam.co.uk For questions related to vaccine distribution or appointments, please email vaccine@[email or web address] or call [phone].     If you have lab work done today you will be contacted with your lab results within the next 2 weeks.  If you have not heard from [place] then please contact us. The fastest way to get your results is to register for My Chart.   IF you received an x-ray today, you will receive an invoice from [facility]. Please contact [facility] at [phone] with questions or concerns regarding your invoice.   IF you received labwork today, you will receive an invoice from [place]. Please contact [facility] at [phone] with questions or concerns regarding your invoice.   Our billing staff will not be able to assist you with questions regarding bills from these companies.  You will be contacted with the lab results as soon as they are available. The fastest way to get your results is to activate your My Chart account. Instructions are located on the last page of this paperwork. If you have not heard from  [place] regarding the results in 2 weeks, please contact this office.

## 2019-12-13 NOTE — Progress Notes (Signed)
Established Patient Office Visit  Subjective:  [name], male    DOB: [birth year]  Age: 74 y.o. MRN: [id number]  CC:  Chief Complaint  Patient presents with  . Annual Exam    physical    HPI Carl Henry presents for CPE  He states that his L shoulder has been more painful. 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 At this point, hard for him to put on a jacket, painful with motion, feels he cannot hold his arm above his head for any appreciable amount of time  Otherwise, some concern for sores on his feet. Have been present for a while - not getting better or worse. No preceding injury. Has not happened before.  We spent a few minutes discussing the COVID-19 vaccine, it's mechanism, and the adverse effects associated with the vaccine.   He states he has had hoarseness in his throat lately - interested in decreasing the use of his Symbicort inhaler. We discussed that he can try, but his hoarseness is just as likely related to OSA that he's experienced or the dry winter air. He has a follow up with pulmonology in May.  Feels good overall. Will get TDap booster today. Other preventative screenings up to date.   Past Medical History:  Diagnosis Date  . Allergic rhinitis 02/17/2014  . Allergy   . GERD (gastroesophageal reflux disease)   . Hyperlipidemia   . HYPERLIPIDEMIA 01/11/2007   Qualifier: Diagnosis of  By: Beryle Lathe    . 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 Nasal polyps   . Pure hypercholesterolemia 02/17/2014  . RHINITIS, ALLERGIC 01/11/2007   08/05/2016.  [place].  [name], MD.  Patient presents for evaluation with intermittent acute uvular swelling, he has had 2 episodes last 3 mths.  Etiology unclear, no anatomic abnormalities.  Patient is not on an ACE inhibitor and no hx of angioedema.  We discussed possible etiologies including atypical reflux with irritation and swelling  along with heavy snoring with edema    Past Surgical History:  Procedure Laterality Date  . COLONOSCOPY  04/09/13   Two polyps.  Outlaw/[facility].  Repeat 5 years.  [name] NASAL POLYP SURGERY    . NASAL SINUS SURGERY  11/15/2003   x 3.  [name].    Family History  Problem Relation Age of Onset  . Osteoporosis Mother   . Hyperlipidemia Father   . Hypertension Father   . Arthritis Father   . Depression Father   . COPD Sister   . COPD Maternal Grandfather     Social History   Socioeconomic History  . Marital status: Married    Spouse name: Jan  . Number of children: 3  . Years of education: Not on file  . Highest education level: Not on file  Occupational History  . Occupation: Retired    Fish farm manager: RETIRED    Comment: Warden/ranger  Tobacco Use  . Smoking status: Never Smoker  . Smokeless tobacco: Never Used  Substance and Sexual Activity  . Alcohol use: No    Alcohol/week: 0.0 standard drinks  . Drug use: No  . Sexual activity: Yes    Partners: Female    Birth control/protection: Post-menopausal  Other Topics Concern  . Not on file  Social History Narrative   Marital status: married x 46 years.      Children: 3 children (twin sons); 6 grandchildren.  Lives: with wife, father.      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      Tobacco: none      Alcohol: none currently; stopped several years ago.       Exercise: walking daily; goes to [employer] five days per week in 2018.         Seatbelt: 100%      ADLs: independent with all ADLs; drives; no assistant devices for ambulation.      Advance Directives: +LIVING WILL; FULL CODE; no prolonged measures.         Social Determinants of Health   Financial Resource Strain: Low Risk   . Difficulty of Paying Living Expenses: Not hard at all  Food Insecurity: No Food Insecurity  . Worried About Charity fundraiser in the Last  Year: Never true  . Ran Out of Food in the Last Year: Never true  Transportation Needs: No Transportation Needs  . Lack of Transportation (Medical): No  . Lack of Transportation (Non-Medical): No  Physical Activity: Inactive  . Days of Exercise per Week: 0 days  . Minutes of Exercise per Session: 0 min  Stress: No Stress Concern Present  . Feeling of Stress : Not at all  Social Connections: Slightly Isolated  . Frequency of Communication with Friends and Family: Three times a week  . Frequency of Social Gatherings with Friends and Family: Twice a week  . Attends Religious Services: 1 to 4 times per year  . Active Member of Clubs or Organizations: No  . Attends Archivist Meetings: Never  . Marital Status: Married  Human resources officer Violence: Not At Risk  . Fear of Current or Ex-Partner: No  . Emotionally Abused: No  . Physically Abused: No  . Sexually Abused: No    Outpatient Medications Prior to Visit  Medication Sig Dispense Refill  . aspirin 81 MG tablet Take 81 mg by mouth 3 (three) times a week. Mon, Wed, Fri    . atorvastatin (LIPITOR) 20 MG tablet Take 1 tablet (20 mg total) by mouth daily. 90 tablet 1  . benzonatate (TESSALON) 200 MG capsule Take 1 capsule (200 mg total) by mouth 2 (two) times daily as needed for cough. 20 capsule 0  . budesonide-formoterol (SYMBICORT) 160-4.5 MCG/ACT inhaler Inhale 2 puffs into the lungs 2 (two) times daily. 1 Inhaler 6  . glucosamine-chondroitin 500-400 MG tablet Take 1 tablet by mouth daily.    [name] loratadine (CLARITIN) 10 MG tablet Take 10 mg by mouth daily. 2 times daily    . montelukast (SINGULAIR) 10 MG tablet Take 1 tablet (10 mg total) by mouth at bedtime. 90 tablet 1  . omeprazole (PRILOSEC) 40 MG capsule take 1 capsule by mouth once daily BEFORE DINNER 30 capsule 0  . triamcinolone (NASACORT ALLERGY 24HR) 55 MCG/ACT AERO nasal inhaler Place 2 sprays into the nose daily.    [name] neomycin-polymyxin-hydrocortisone (CORTISPORIN)  OTIC solution Apply 1-2 drops to toe after soaking twice a day (Patient not taking: Reported on 12/13/2019) 10 mL 0   No facility-administered medications prior to visit.    No Known Allergies  ROS Review of Systems  Constitutional: Negative.   HENT: Negative.   Eyes: Negative.   Respiratory: Negative.   Cardiovascular: Negative.   Gastrointestinal: Negative.   Endocrine: Negative.   Genitourinary: Negative.   Musculoskeletal: Negative.   Skin: Negative.  Allergic/Immunologic: Negative.   Neurological: Negative.   Hematological: Negative.   Psychiatric/Behavioral: Negative.   All other systems reviewed and are negative.     Objective:    Physical Exam  Constitutional: He is oriented to person, place, and time. [name]. No distress.  HENT:  Head: Normocephalic and atraumatic.  Right Ear: External ear normal.  Left Ear: External ear normal.  Nose: Nose normal.  Mouth/Throat: Oropharynx is clear and moist. No oropharyngeal exudate.  Eyes: Pupils are equal, round, and reactive to light. Conjunctivae and EOM are normal. Right eye exhibits no discharge. Left eye exhibits no discharge. No scleral icterus.  Neck: No tracheal deviation present. No thyromegaly present.  Cardiovascular: Normal rate, regular rhythm, normal heart sounds and intact distal pulses. Exam reveals no gallop and no friction rub.  No murmur heard. Pulmonary/Chest: Effort normal and breath sounds normal. No respiratory distress. He has no wheezes. He has no rales. He exhibits no tenderness.  Abdominal: Soft. Bowel sounds are normal. He exhibits no distension and no mass. There is no abdominal tenderness. There is no rebound and no guarding.  Musculoskeletal:        General: No tenderness, deformity or edema. Normal range of motion.     Left shoulder: Decreased strength.     Cervical back: Normal range of motion and neck supple.  Lymphadenopathy:    He has no cervical  adenopathy.  Neurological: He is alert and oriented to person, place, and time. He has normal reflexes. No cranial nerve deficit. He exhibits normal muscle tone. Coordination normal.  Skin: Skin is warm and dry. No rash noted. He is not diaphoretic. No erythema. No pallor.     Psychiatric: He has a normal mood and affect. His behavior is normal. Judgment and thought content normal.  Nursing note and vitals reviewed.   BP 117/72   Pulse 81   Temp (!) 97.2 F (36.2 C) (Temporal)   Ht 5\' 11"  (1.803 m)   Wt 203 lb 6.4 oz (92.3 kg)   SpO2 98%   BMI 28.37 kg/m  Wt Readings from Last 3 Encounters:  12/13/19 203 lb 6.4 oz (92.3 kg)  09/19/19 212 lb 12.8 oz (96.5 kg)  07/25/19 207 lb (93.9 kg)     Health Maintenance Due  Topic Date Due  . TETANUS/TDAP  03/04/2018    There are no preventive care reminders to display for this patient.  No results found for: TSH Lab Results  Component Value Date   WBC 7.5 07/16/2019   HGB 15.1 07/16/2019   HCT 45.6 07/16/2019   MCV 84 07/16/2019   PLT 277 07/16/2019   Lab Results  Component Value Date   NA 144 07/16/2019   K 4.3 07/16/2019   CO2 23 07/16/2019   GLUCOSE 91 07/16/2019   BUN 11 07/16/2019   CREATININE 1.00 07/16/2019   BILITOT 0.3 04/26/2019   ALKPHOS 78 04/26/2019   AST 26 04/26/2019   ALT 26 04/26/2019   PROT 6.7 04/26/2019   ALBUMIN 4.6 04/26/2019   CALCIUM 9.4 07/16/2019   ANIONGAP 8 12/25/2018   Lab Results  Component Value Date   CHOL 201 (H) 04/26/2019   Lab Results  Component Value Date   HDL 46 04/26/2019   Lab Results  Component Value Date   LDLCALC 134 (H) 04/26/2019   Lab Results  Component Value Date   TRIG 106 04/26/2019   Lab Results  Component Value Date   CHOLHDL 4.4 04/26/2019  No results found for: HGBA1C    Assessment & Plan:   Problem List Items Addressed This Visit    None    Visit Diagnoses    Primary osteoarthritis of left shoulder    -  Primary   Relevant Orders   DG  Shoulder Left   Need for diphtheria-tetanus-pertussis (Tdap) vaccine       Relevant Orders   Tdap vaccine greater than or equal to 7yo IM   Screening for endocrine, metabolic and immunity disorder       Relevant Orders   CBC With Differential   Comprehensive metabolic panel   TSH   Hemoglobin A1c   Lipid screening       Relevant Orders   Lipid panel   Annual physical exam          No orders of the defined types were placed in this encounter.   Follow-up: No follow-ups on file.   PLAN  Decrease strength in L shoulder.  Otherwise normal findings on exam.  TDap given  Discussed options for shoulder pain  Discussed COVID vaccine at length  Patient encouraged to call clinic with any questions, comments, or concerns.  Carl Coss, NP

## 2019-12-14 LAB — TSH: TSH: 1.9 u[IU]/mL (ref 0.450–4.500)

## 2019-12-14 LAB — LIPID PANEL
Chol/HDL Ratio: 4.2 ratio (ref 0.0–5.0)
Cholesterol, Total: 176 mg/dL (ref 100–199)
HDL: 42 mg/dL (ref >39)
LDL Chol Calc (NIH): 117 mg/dL — ABNORMAL HIGH (ref 0–99)
Triglycerides: 89 mg/dL (ref 0–149)
VLDL Cholesterol Cal: 17 mg/dL (ref 5–40)

## 2019-12-14 LAB — CBC WITH DIFFERENTIAL
Basophils Absolute: 0.1 10*3/uL (ref 0.0–0.2)
Basos: 1 %
EOS (ABSOLUTE): 0.2 10*3/uL (ref 0.0–0.4)
Eos: 3 %
Hematocrit: 49.3 % (ref 37.5–51.0)
Hemoglobin: 15.8 g/dL (ref 13.0–17.7)
Immature Grans (Abs): 0 10*3/uL (ref 0.0–0.1)
Immature Granulocytes: 0 %
Lymphocytes Absolute: 1.6 10*3/uL (ref 0.7–3.1)
Lymphs: 22 %
MCH: 27.1 pg (ref 26.6–33.0)
MCHC: 32 g/dL (ref 31.5–35.7)
MCV: 85 fL (ref 79–97)
Monocytes Absolute: 0.7 10*3/uL (ref 0.1–0.9)
Monocytes: 9 %
Neutrophils Absolute: 4.5 10*3/uL (ref 1.4–7.0)
Neutrophils: 65 %
RBC: 5.82 x10E6/uL — ABNORMAL HIGH (ref 4.14–5.80)
RDW: 13.7 % (ref 11.6–15.4)
WBC: 7 10*3/uL (ref 3.4–10.8)

## 2019-12-14 LAB — COMPREHENSIVE METABOLIC PANEL
ALT: 15 IU/L (ref 0–44)
AST: 21 IU/L (ref 0–40)
Albumin/Globulin Ratio: 2 (ref 1.2–2.2)
Albumin: 4.4 g/dL (ref 3.7–4.7)
Alkaline Phosphatase: 91 IU/L (ref 39–117)
BUN/Creatinine Ratio: 16 (ref 10–24)
BUN: 16 mg/dL (ref 8–27)
Bilirubin Total: 0.5 mg/dL (ref 0.0–1.2)
CO2: 22 mmol/L (ref 20–29)
Calcium: 9.6 mg/dL (ref 8.6–10.2)
Chloride: 104 mmol/L (ref 96–106)
Creatinine, Ser: 0.98 mg/dL (ref 0.76–1.27)
GFR calc Af Amer: 89 mL/min/{1.73_m2} (ref >59)
GFR calc non Af Amer: 77 mL/min/{1.73_m2} (ref >59)
Globulin, Total: 2.2 g/dL (ref 1.5–4.5)
Glucose: 95 mg/dL (ref 65–99)
Potassium: 4.4 mmol/L (ref 3.5–5.2)
Sodium: 138 mmol/L (ref 134–144)
Total Protein: 6.6 g/dL (ref 6.0–8.5)

## 2019-12-14 LAB — HEMOGLOBIN A1C
Est. average glucose Bld gHb Est-mCnc: 120 mg/dL
Hgb A1c MFr Bld: 5.8 % — ABNORMAL HIGH (ref 4.8–5.6)

## 2019-12-16 ENCOUNTER — Encounter: Payer: Self-pay | Admitting: Registered Nurse

## 2019-12-16 NOTE — Progress Notes (Signed)
Labs not concerning Letter sent via [place], NP

## 2019-12-26 ENCOUNTER — Encounter: Payer: Self-pay | Admitting: Registered Nurse

## 2019-12-26 ENCOUNTER — Telehealth: Payer: Self-pay

## 2019-12-26 NOTE — Telephone Encounter (Signed)
Spoke with pt to let him know that the referral was placed on 12/13/2019. The referral dept will contact him in regards to setting up an appt and there is no other actions that is needed on his end.

## 2020-01-01 DIAGNOSIS — B353 Tinea pedis: Secondary | ICD-10-CM | POA: Diagnosis not present

## 2020-01-01 DIAGNOSIS — D2271 Melanocytic nevi of right lower limb, including hip: Secondary | ICD-10-CM | POA: Diagnosis not present

## 2020-01-01 DIAGNOSIS — D2272 Melanocytic nevi of left lower limb, including hip: Secondary | ICD-10-CM | POA: Diagnosis not present

## 2020-01-01 DIAGNOSIS — L821 Other seborrheic keratosis: Secondary | ICD-10-CM | POA: Diagnosis not present

## 2020-01-01 DIAGNOSIS — D485 Neoplasm of uncertain behavior of skin: Secondary | ICD-10-CM | POA: Diagnosis not present

## 2020-01-01 DIAGNOSIS — D225 Melanocytic nevi of trunk: Secondary | ICD-10-CM | POA: Diagnosis not present

## 2020-01-08 ENCOUNTER — Other Ambulatory Visit: Payer: Self-pay

## 2020-01-08 ENCOUNTER — Encounter: Payer: Self-pay | Admitting: Registered Nurse

## 2020-01-08 ENCOUNTER — Ambulatory Visit
Admission: RE | Admit: 2020-01-08 | Discharge: 2020-01-08 | Disposition: A | Discharge status: Home / Self Care | Payer: PPO | Source: Ambulatory Visit | Attending: Registered Nurse | Admitting: Registered Nurse

## 2020-01-08 DIAGNOSIS — R1032 Left lower quadrant pain: Secondary | ICD-10-CM

## 2020-01-08 DIAGNOSIS — R109 Unspecified abdominal pain: Secondary | ICD-10-CM | POA: Diagnosis not present

## 2020-01-08 MED ORDER — IOPAMIDOL (ISOVUE-300) INJECTION 61%
100.0000 mL | Freq: Once | INTRAVENOUS | Status: AC | PRN
  Administered 2020-01-08: 100 mL via INTRAVENOUS

## 2020-03-02 ENCOUNTER — Other Ambulatory Visit: Payer: Self-pay | Admitting: Podiatry

## 2020-03-02 ENCOUNTER — Encounter: Payer: Self-pay | Admitting: Registered Nurse

## 2020-03-03 ENCOUNTER — Other Ambulatory Visit: Payer: Self-pay

## 2020-03-03 MED ORDER — OMEPRAZOLE 40 MG PO CPDR: DELAYED_RELEASE_CAPSULE | ORAL | 0 refills | Status: DC

## 2020-03-03 NOTE — Telephone Encounter (Signed)
Called patient to inform him that the medication was sent to his pharmacy 03/03/2020 8:44 am.

## 2020-03-10 ENCOUNTER — Encounter: Payer: Self-pay | Admitting: Registered Nurse

## 2020-03-10 MED ORDER — LORATADINE 10 MG PO TABS: 10.0000 mg | ORAL_TABLET | Freq: Every day | ORAL | 0 refills | Status: AC

## 2020-04-07 ENCOUNTER — Ambulatory Visit: Payer: PPO | Admitting: Pulmonary Disease

## 2020-04-07 ENCOUNTER — Encounter: Payer: Self-pay | Admitting: Pulmonary Disease

## 2020-04-07 ENCOUNTER — Other Ambulatory Visit: Payer: Self-pay

## 2020-04-07 VITALS — BP 118/64 | HR 59 | Temp 97.9°F | Ht 71.0 in | Wt 195.8 lb

## 2020-04-07 DIAGNOSIS — J4540 Moderate persistent asthma, uncomplicated: Secondary | ICD-10-CM | POA: Diagnosis not present

## 2020-04-07 MED ORDER — SPACER/AERO-HOLD CHAMBER BAGS MISC: 1.0000 | 0 refills | Status: AC

## 2020-04-07 MED ORDER — BUDESONIDE-FORMOTEROL FUMARATE 80-4.5 MCG/ACT IN AERO: 1.0000 | INHALATION_SPRAY | Freq: Two times a day (BID) | RESPIRATORY_TRACT | 5 refills | Status: DC

## 2020-04-07 NOTE — Progress Notes (Signed)
Carl Henry    [id number]    November 29, 1945  Primary Care Physician:Morrow, Delfino Lovett, NP  Referring Physician: Maximiano Coss, NP [place],  [place] [id number]  Chief complaint: Follow-up for asthma  HPI: 74 year old with history of hyperlipidemia, allergies, chronic rhinosinusitis with nasal polyps Complains of recurrent attacks of chronic bronchitis, cough since January 2020.  Also suffered broken ribs from a fall, excessive coughing. In January he was treated with prednisone for 10 days and given an albuterol inhaler.  He states that his symptoms improved while on steroids but recur once he stops the treatment.  Seen in ED on 9/2 for cough, dyspnea with normal chest x-ray.  Labs noted for significant peripheral eosinophilia.  He has history of chronic rhinosinusitis and nasal polyps status post sinus surgery in 2005.  Seen by Dr. Valetta Mole, ENT in 2018  for swollen uvula.  Findings consistent with GERD and PPI therapy recommended.  Pets: No pets Occupation: Used to work for occupational therapy.  Currently retired Exposures: No known exposures.  No mold, hot tub, Jacuzzi Smoking history: Never smoker Travel history: Originally from [place].  No significant recent travel Relevant family history: Grandfather had COPD.  He was a smoker.  Interim history:  Doing well with regard to breathing.  Has developed hoarseness of voice with the Symbicort.  He took himself off Symbicort for about a week with improvement in hoarseness but has noticed increasing dyspnea, chest tightness since placed back on the inhaler.  Outpatient Encounter Medications as of 04/07/2020  Medication Sig  . aspirin 81 MG tablet Take 81 mg by mouth 3 (three) times a week. Mon, Wed, Fri  . atorvastatin (LIPITOR) 20 MG tablet Take 1 tablet (20 mg total) by mouth daily.  . budesonide-formoterol (SYMBICORT) 160-4.5 MCG/ACT inhaler Inhale 2 puffs into the lungs 2 (two) times daily.  [name]  glucosamine-chondroitin 500-400 MG tablet Take 1 tablet by mouth daily.  [name] loratadine (CLARITIN) 10 MG tablet Take 1 tablet (10 mg total) by mouth daily. 2 times daily  . montelukast (SINGULAIR) 10 MG tablet Take 1 tablet (10 mg total) by mouth at bedtime.  [name] omeprazole (PRILOSEC) 40 MG capsule take 1 capsule by mouth once daily BEFORE DINNER  . triamcinolone (NASACORT ALLERGY 24HR) 55 MCG/ACT AERO nasal inhaler Place 2 sprays into the nose daily. As needed  . [DISCONTINUED] benzonatate (TESSALON) 200 MG capsule Take 1 capsule (200 mg total) by mouth 2 (two) times daily as needed for cough.  . [DISCONTINUED] meloxicam (MOBIC) 7.5 MG tablet Take 1 tablet (7.5 mg total) by mouth daily.  . [DISCONTINUED] mupirocin ointment (BACTROBAN) 2 % Place 1 application into the nose 2 (two) times daily.  . [DISCONTINUED] neomycin-polymyxin-hydrocortisone (CORTISPORIN) OTIC solution Apply 1-2 drops to toe after soaking twice a day (Patient not taking: Reported on 12/13/2019)   No facility-administered encounter medications on file as of 04/07/2020.    Allergies as of 04/07/2020  . (No Known Allergies)   Physical Exam: Blood pressure 118/64, pulse (!) 59, temperature 97.9 F (36.6 C), temperature source Oral, height 5\' 11"  (1.803 m), weight 195 lb 12.8 oz (88.8 kg), SpO2 98 %. Gen:      No acute distress HEENT:  EOMI, sclera anicteric Neck:     No masses; no thyromegaly Lungs:    Clear to auscultation bilaterally; normal respiratory effort CV:         Regular rate and rhythm; no murmurs Abd:      +  bowel sounds; soft, non-tender; no palpable masses, no distension Ext:    No edema; adequate peripheral perfusion Skin:      Warm and dry; no rash Neuro: alert and oriented x 3 Psych: normal mood and affect  Data Reviewed: Imaging: CT chest 12/25/2018- no evidence of interstitial lung disease.  Minimal bibasilar atelectasis Chest x-ray 07/15/19- minimal bibasilar opacities.  I have reviewed the images  personally.  PFTs: 09/12/19-FVC 4.21 [92%], FEV1 3.58 [106%), F/F 85, TLC 7.34 [101%], DLCO 28.13 [105%) Minimal obstruction  Act score 03/22/2020-24  Labs: WBC 07/16/2019-WBC 7.5, eos 7%, absolute eosinophil count 525 Respiratory allergy profile 07/23/2019-IgE 45, RAST panel negative SARS-CoV-2 04/26/2019-negative  Assessment:  Moderate persistent asthma Likely has asthma, reactive airway disease given history of sinus issues, GERD, elevated peripheral eosinophils PFTs reviewed with no overt obstruction but curvature to flow loop in FEV1 and mid flow rates suggestive of reactive airways  Now has hoarseness of voice.  No thrush on examination. We will change the dose of Symbicort to 80/4.5.  Use 1 puff twice daily. he can use additional puffs as needed for shortness of breath We will also give a spacer device to prevent the deposition of the medication in your mouth and throat which will help with the hoarseness.  Chronic sinusitis Continue steroid nasal spray, Singulair and Claritin  Acid reflux Continue Prilosec  Plan/Recommendations: - Symbicort. Change to 80/4.5, 1 puff twice daily - Use spacer device - Steroid nasal spray, Singulair, Claritin  Marshell Garfinkel MD [facility] 04/07/2020, 8:57 AM  CC: Maximiano Coss, NP

## 2020-04-07 NOTE — Addendum Note (Signed)
Addended by: Elton Sin on: 04/07/2020 09:21 AM   Modules accepted: Orders

## 2020-04-07 NOTE — Patient Instructions (Addendum)
Sorry that you are having hoarseness of voice with the inhaler We will change the dose of Symbicort to 80/4.5.  Use 1 puff twice daily. You can use additional puffs as needed for shortness of breath We will also give you a spacer device to prevent the deposition of the medication in your mouth and throat which will help with the hoarseness.  Follow-up in 3 months.

## 2020-04-20 DIAGNOSIS — H5201 Hypermetropia, right eye: Secondary | ICD-10-CM | POA: Diagnosis not present

## 2020-04-20 DIAGNOSIS — H5211 Myopia, right eye: Secondary | ICD-10-CM | POA: Diagnosis not present

## 2020-04-20 DIAGNOSIS — H524 Presbyopia: Secondary | ICD-10-CM | POA: Diagnosis not present

## 2020-04-20 DIAGNOSIS — H52223 Regular astigmatism, bilateral: Secondary | ICD-10-CM | POA: Diagnosis not present

## 2020-05-06 ENCOUNTER — Encounter: Payer: Self-pay | Admitting: Primary Care

## 2020-05-06 ENCOUNTER — Other Ambulatory Visit: Payer: Self-pay

## 2020-05-06 ENCOUNTER — Ambulatory Visit: Payer: PPO | Admitting: Primary Care

## 2020-05-06 DIAGNOSIS — J4540 Moderate persistent asthma, uncomplicated: Secondary | ICD-10-CM

## 2020-05-06 DIAGNOSIS — J301 Allergic rhinitis due to pollen: Secondary | ICD-10-CM | POA: Diagnosis not present

## 2020-05-06 NOTE — Progress Notes (Signed)
@Patient  ID: Carl Henry, male    DOB: 01/30/1946, 74 y.o.   MRN: [id number]  Chief Complaint  Patient presents with  . Acute Visit    increased SOB for 1 week    Referring provider: Maximiano Coss, NP  HPI: 74 year old male, never smoked.  Past medical history significant for moderate persistent asthma, seasonal allergic rhinitis, laryngeal reflux, hypercholesterolemia.  Patient of Dr. Vaughan Browner, last seen in office on 04/07/2020.  Previous [facility] pulmonary encounter: History of hyperlipidemia, allergies, chronic rhinosinusitis with nasal polyps Complains of recurrent attacks of chronic bronchitis, cough since January 2020.  Also suffered broken ribs from a fall, excessive coughing. In January he was treated with prednisone for 10 days and given an albuterol inhaler.  He states that his symptoms improved while on steroids but recur once he stops the treatment.  Seen in ED on 9/2 for cough, dyspnea with normal chest x-ray.  Labs noted for significant peripheral eosinophilia.  He has history of chronic rhinosinusitis and nasal polyps status post sinus surgery in 2005.  Seen by Dr. Valetta Mole, ENT in 2018  for swollen uvula.  Findings consistent with GERD and PPI therapy recommended.  04/07/20- Dr. Vaughan Browner  Doing well with regard to breathing.  Has developed hoarseness of voice with the Symbicort.  He took himself off Symbicort for about a week with improvement in hoarseness but has noticed increasing dyspnea, chest tightness since placed back on the inhaler.  05/06/2020 - Interim history Patient presents today for acute visit. PFTs showed no overt obstruction but curvature on flow loop in FEV1 and mid flow rates suggest reactive airway.  Felt to likely have asthma, reactive airway disease given history of sinus issues, GERD and elevated peripheral eosinophils.  During last visit his Symbicort was changed to 80/4.51 puff twice daily.  He was instructed that he could use additional puffs as  needed for shortness of breath.   Over the past week he has been having more shortness of breath. He was recently at the beach and may have over exerted himself.  Associated voice hoarseness and postnasal drip symptoms.  He did notice mild upper airway wheezing on exhale a couple of times while at the beach.  Is currently using Symbicort 1 puff twice daily.  He did not bring AeroChamber with him while on vacation.  He is compliant with Singulair, Claritin, Protonix.  He denies significant weight gain or leg swelling.   Exposure history:  Pets: No pets Occupation: Used to work for occupational therapy.  Currently retired Exposures: No known exposures.  No mold, hot tub, Jacuzzi Smoking history: Never smoker Travel history: Originally from [place].  No significant recent travel Relevant family history: Grandfather had COPD.  He was a smoker.   No Known Allergies  Immunization History  Administered Date(s) Administered  . Fluad Quad(high Dose 65+) 07/23/2019  . Hepatitis A 03/22/2010, 03/02/2011  . Influenza, High Dose Seasonal PF 07/14/2017, 09/10/2018  . Influenza,inj,Quad PF,6+ Mos 08/29/2013, 08/25/2014, 09/21/2015, 09/14/2016  . Influenza-Unspecified 08/28/2017  . Moderna SARS-COVID-2 Vaccination 12/27/2019, 01/24/2020  . Pneumococcal Conjugate-13 08/25/2014  . Pneumococcal Polysaccharide-23 08/29/2013  . Td 08/14/2004  . Tdap 03/04/2008, 12/13/2019    Past Medical History:  Diagnosis Date  . Allergic rhinitis 02/17/2014  . Allergy   . GERD (gastroesophageal reflux disease)   . Hyperlipidemia   . HYPERLIPIDEMIA 01/11/2007   Qualifier: Diagnosis of  By: Beryle Lathe    . 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 Nasal polyps   . Pure hypercholesterolemia  02/17/2014  . RHINITIS, ALLERGIC 01/11/2007   08/05/2016.  Odin.  [name], MD.  Patient presents for evaluation with intermittent acute uvular swelling, he has had 2 episodes last 3 mths.  Etiology  unclear, no anatomic abnormalities.  Patient is not on an ACE inhibitor and no hx of angioedema.  We discussed possible etiologies including atypical reflux with irritation and swelling along with heavy snoring with edema    Tobacco History: Social History   Tobacco Use  Smoking Status Never Smoker  Smokeless Tobacco Never Used   Counseling given: Not Answered   Outpatient Medications Prior to Visit  Medication Sig Dispense Refill  . aspirin 81 MG tablet Take 81 mg by mouth 3 (three) times a week. Mon, Wed, Fri    . atorvastatin (LIPITOR) 20 MG tablet Take 1 tablet (20 mg total) by mouth daily. 90 tablet 1  . budesonide-formoterol (SYMBICORT) 80-4.5 MCG/ACT inhaler Inhale 1 puff into the lungs 2 (two) times daily. 1 Inhaler 5  . glucosamine-chondroitin 500-400 MG tablet Take 1 tablet by mouth daily.    [name] loratadine (CLARITIN) 10 MG tablet Take 1 tablet (10 mg total) by mouth daily. 2 times daily 90 tablet 0  . montelukast (SINGULAIR) 10 MG tablet Take 1 tablet (10 mg total) by mouth at bedtime. 90 tablet 1  . omeprazole (PRILOSEC) 40 MG capsule take 1 capsule by mouth once daily BEFORE DINNER 90 capsule 0  . Spacer/Aero-Hold Chamber Bags MISC 1 Device by Does not apply route as directed. 1 Device 0  . triamcinolone (NASACORT ALLERGY 24HR) 55 MCG/ACT AERO nasal inhaler Place 2 sprays into the nose daily. As needed    . budesonide-formoterol (SYMBICORT) 160-4.5 MCG/ACT inhaler Inhale 2 puffs into the lungs 2 (two) times daily. 1 Inhaler 6   No facility-administered medications prior to visit.   Review of Systems  Review of Systems  HENT: Positive for postnasal drip.   Respiratory: Positive for shortness of breath and wheezing. Negative for chest tightness.   Cardiovascular: Negative.  Negative for leg swelling.   Physical Exam  BP 122/70 (BP Location: Left Arm, Cuff Size: Normal)   Pulse 61   Temp (!) 97.3 F (36.3 C) (Oral)   Ht 5\' 11"  (1.803 m)   Wt 196 lb 12.8 oz (89.3 kg)    SpO2 96%   BMI 27.45 kg/m  Physical Exam Constitutional:      Appearance: Normal appearance.  HENT:     Head: Normocephalic and atraumatic.     Right Ear: Tympanic membrane normal. There is no impacted cerumen.     Left Ear: Tympanic membrane normal. There is no impacted cerumen.     Mouth/Throat:     Mouth: Mucous membranes are moist.     Pharynx: Oropharynx is clear.  Cardiovascular:     Rate and Rhythm: Normal rate and regular rhythm.     Comments: No edema Pulmonary:     Effort: Pulmonary effort is normal.     Breath sounds: Normal breath sounds.     Comments: Clear to auscultation Skin:    General: Skin is warm and dry.  Neurological:     General: No focal deficit present.     Mental Status: He is alert and oriented to person, place, and time. Mental status is at baseline.  Psychiatric:        Mood and Affect: Mood normal.        Behavior: Behavior normal.        Thought  Content: Thought content normal.      Lab Results:  CBC    Component Value Date/Time   WBC 7.0 12/13/2019 0928   WBC 8.8 12/25/2018 1127   RBC 5.82 (H) 12/13/2019 0928   RBC 5.59 12/25/2018 1127   HGB 15.8 12/13/2019 0928   HCT 49.3 12/13/2019 0928   PLT 277 07/16/2019 1540   MCV 85 12/13/2019 0928   MCH 27.1 12/13/2019 0928   MCH 28.4 12/25/2018 1127   MCHC 32.0 12/13/2019 0928   MCHC 32.6 12/25/2018 1127   RDW 13.7 12/13/2019 0928   LYMPHSABS 1.6 12/13/2019 0928   MONOABS 0.8 12/25/2018 1127   EOSABS 0.2 12/13/2019 0928   BASOSABS 0.1 12/13/2019 0928    BMET    Component Value Date/Time   NA 138 12/13/2019 0928   K 4.4 12/13/2019 0928   CL 104 12/13/2019 0928   CO2 22 12/13/2019 0928   GLUCOSE 95 12/13/2019 0928   GLUCOSE 106 (H) 12/25/2018 1127   BUN 16 12/13/2019 0928   CREATININE 0.98 12/13/2019 0928   CREATININE 0.82 04/19/2016 0906   CALCIUM 9.6 12/13/2019 0928   GFRNONAA 77 12/13/2019 0928   GFRNONAA 89 08/25/2014 0900   GFRAA 89 12/13/2019 0928   GFRAA >89  08/25/2014 0900    BNP    Component Value Date/Time   BNP 30.1 07/16/2019 1504    ProBNP No results found for: PROBNP  Imaging: No results found.   Assessment & Plan:   Moderate persistent asthma without complication -Increased shortness of breath over the last week with associated upper airway wheezing and postnasal drip -Instructed patient to increase Symbicort 80/4.5 to 2 puffs twice daily for the next 2 weeks; he can then taper to 1 puff twice daily used with AeroChamber -Continue Singulair 10 mg at bedtime -If no improvement with above plan recommend prednisone taper; may consider repeat labs and chest x-ray as well if patient still complaining of shortness of breath  Allergic rhinitis -Instructed patient to use Nasacort daily for the next 2 weeks then as needed -Recommend adding Ocean nasal spray twice daily -Tinea Claritin 10 mg daily   Martyn Ehrich, NP 05/06/2020

## 2020-05-06 NOTE — Assessment & Plan Note (Addendum)
-  Instructed patient to use Nasacort daily for the next 2 weeks then as needed -Recommend adding Ocean nasal spray twice daily -Continue Claritin 10 mg daily

## 2020-05-06 NOTE — Assessment & Plan Note (Signed)
-  Increased shortness of breath over the last week with associated upper airway wheezing and postnasal drip -Instructed patient to increase Symbicort 80/4.5 to 2 puffs twice daily for the next 2 weeks; he can then taper to 1 puff twice daily used with AeroChamber -Continue Singulair 10 mg at bedtime -If no improvement with above plan recommend prednisone taper; may consider repeat labs and chest x-ray as well if patient still complaining of shortness of breath

## 2020-05-06 NOTE — Patient Instructions (Addendum)
Recommendations: - Increase Symbicort to 2 puffs twice daily for the next two weeks; then try tapering down to 1 puff twice daily - Use Nasacort nasal spray every day for the next 2 weeks; then as needed - Add Ocean/saline nasal spray twice daily for postnasal drip symptoms - If you continue to have shortness of breath despite above plan please notify our office and we will try a prednisone taper  Follow-up: - Due for follow-up with Dr. Vaughan Browner in September as previously scheduled   [email or web address]  Asthma, Adult  Asthma is a long-term (chronic) condition that causes recurrent episodes in which the airways become tight and narrow. The airways are the passages that lead from the nose and mouth down into the lungs. Asthma episodes, also called asthma attacks, can cause coughing, wheezing, shortness of breath, and chest pain. The airways can also fill with mucus. During an attack, it can be difficult to breathe. Asthma attacks can range from minor to life threatening. Asthma cannot be cured, but medicines and lifestyle changes can help control it and treat acute attacks. What are the causes? This condition is believed to be caused by inherited (genetic) and environmental factors, but its exact cause is not known. There are many things that can bring on an asthma attack or make asthma symptoms worse (triggers). Asthma triggers are different for each person. Common triggers include:  Mold.  Dust.  Cigarette smoke.  Cockroaches.  Things that can cause allergy symptoms (allergens), such as animal dander or pollen from trees or grass.  Air pollutants such as household cleaners, wood smoke, smog, or Advertising account planner.  Cold air, weather changes, and winds (which increase molds and pollen in the air).  Strong emotional expressions such as crying or laughing hard.  Stress.  Certain medicines (such as aspirin) or types of medicines (such as  beta-blockers).  Sulfites in foods and drinks. Foods and drinks that may contain sulfites include dried fruit, potato chips, and sparkling grape juice.  Infections or inflammatory conditions such as the flu, a cold, or inflammation of the nasal membranes (rhinitis).  Gastroesophageal reflux disease (GERD).  Exercise or strenuous activity. What are the signs or symptoms? Symptoms of this condition may occur right after asthma is triggered or many hours later. Symptoms include:  Wheezing. This can sound like whistling when you breathe.  Excessive nighttime or early morning coughing.  Frequent or severe coughing with a common cold.  Chest tightness.  Shortness of breath.  Tiredness (fatigue) with minimal activity. How is this diagnosed? This condition is diagnosed based on:  Your medical history.  A physical exam.  Tests, which may include: ? Lung function studies and pulmonary studies (spirometry). These tests can evaluate the flow of air in your lungs. ? Allergy tests. ? Imaging tests, such as X-rays. How is this treated? There is no cure for this condition, but treatment can help control your symptoms. Treatment for asthma usually involves:  Identifying and avoiding your asthma triggers.  Using medicines to control your symptoms. Generally, two types of medicines are used to treat asthma: ? Controller medicines. These help prevent asthma symptoms from occurring. They are usually taken every day. ? Fast-acting reliever or rescue medicines. These quickly relieve asthma symptoms by widening the narrow and tight airways. They are used as needed and provide short-term relief.  Using supplemental oxygen. This may be needed during a severe episode.  Using other medicines, such as: ? Allergy medicines, such as antihistamines, if your asthma attacks are triggered  by allergens. ? Immune medicines (immunomodulators). These are medicines that help control the immune  system.  Creating an asthma action plan. An asthma action plan is a written plan for managing and treating your asthma attacks. This plan includes: ? A list of your asthma triggers and how to avoid them. ? Information about when medicines should be taken and when their dosage should be changed. ? Instructions about using a device called a peak flow meter. A peak flow meter measures how well the lungs are working and the severity of your asthma. It helps you monitor your condition. Follow these instructions at home: Controlling your home environment Control your home environment in the following ways to help avoid triggers and prevent asthma attacks:  Change your heating and air conditioning filter regularly.  Limit your use of fireplaces and wood stoves.  Get rid of pests (such as roaches and mice) and their droppings.  Throw away plants if you see mold on them.  Clean floors and dust surfaces regularly. Use unscented cleaning products.  Try to have someone else vacuum for you regularly. Stay out of rooms while they are being vacuumed and for a short while afterward. If you vacuum, use a dust mask from a hardware store, a double-layered or microfilter vacuum cleaner bag, or a vacuum cleaner with a HEPA filter.  Replace carpet with wood, tile, or vinyl flooring. Carpet can trap dander and dust.  Use allergy-proof pillows, mattress covers, and box spring covers.  Keep your bedroom a trigger-free room.  Avoid pets and keep windows closed when allergens are in the air.  Wash beddings every week in hot water and dry them in a dryer.  Use blankets that are made of polyester or cotton.  Clean bathrooms and kitchens with bleach. If possible, have someone repaint the walls in these rooms with mold-resistant paint. Stay out of the rooms that are being cleaned and painted.  Wash your hands often with soap and water. If soap and water are not available, use hand sanitizer.  Do not allow  anyone to smoke in your home. General instructions  Take over-the-counter and prescription medicines only as told by your health care provider. ? Speak with your health care provider if you have questions about how or when to take the medicines. ? Make note if you are requiring more frequent dosages.  Do not use any products that contain nicotine or tobacco, such as cigarettes and e-cigarettes. If you need help quitting, ask your health care provider. Also, avoid being exposed to secondhand smoke.  Use a peak flow meter as told by your health care provider. Record and keep track of the readings.  Understand and use the asthma action plan to help minimize, or stop an asthma attack, without needing to seek medical care.  Make sure you stay up to date on your yearly vaccinations as told by your health care provider. This may include vaccines for the flu and pneumonia.  Avoid outdoor activities when allergen counts are high and when air quality is low.  Wear a ski mask that covers your nose and mouth during outdoor winter activities. Exercise indoors on cold days if you can.  Warm up before exercising, and take time for a cool-down period after exercise.  Keep all follow-up visits as told by your health care provider. This is important. Where to find more information  For information about asthma, turn to the Centers for Disease Control and Prevention at http://www.clark.net/.htm  For air quality information,  turn to [employer] at WeightRating.nl Contact a health care provider if:  You have wheezing, shortness of breath, or a cough even while you are taking medicine to prevent attacks.  The mucus you cough up (sputum) is thicker than usual.  Your sputum changes from clear or white to yellow, green, gray, or bloody.  Your medicines are causing side effects, such as a rash, itching, swelling, or trouble breathing.  You need to use a reliever medicine more than 2-3 times a  week.  Your peak flow reading is still at 50-79% of your personal best after following your action plan for 1 hour.  You have a fever. Get help right away if:  You are getting worse and do not respond to treatment during an asthma attack.  You are short of breath when at rest or when doing very little physical activity.  You have difficulty eating, drinking, or talking.  You have chest pain or tightness.  You develop a fast heartbeat or palpitations.  You have a bluish color to your lips or fingernails.  You are light-headed or dizzy, or you faint.  Your peak flow reading is less than 50% of your personal best.  You feel too tired to breathe normally. Summary  Asthma is a long-term (chronic) condition that causes recurrent episodes in which the airways become tight and narrow. These episodes can cause coughing, wheezing, shortness of breath, and chest pain.  Asthma cannot be cured, but medicines and lifestyle changes can help control it and treat acute attacks.  Make sure you understand how to avoid triggers and how and when to use your medicines.  Asthma attacks can range from minor to life threatening. Get help right away if you have an asthma attack and do not respond to treatment with your usual rescue medicines. This information is not intended to replace advice given to you by your health care provider. Make sure you discuss any questions you have with your health care provider. Document Revised: 01/03/2019 Document Reviewed: 12/05/2016 Elsevier Patient Education  2020 [employer].

## 2020-05-11 ENCOUNTER — Other Ambulatory Visit: Payer: Self-pay | Admitting: Registered Nurse

## 2020-05-11 ENCOUNTER — Other Ambulatory Visit: Payer: Self-pay | Admitting: Emergency Medicine

## 2020-05-11 DIAGNOSIS — E7800 Pure hypercholesterolemia, unspecified: Secondary | ICD-10-CM

## 2020-05-11 MED ORDER — ATORVASTATIN CALCIUM 20 MG PO TABS: ORAL_TABLET | ORAL | 0 refills | Status: DC

## 2020-05-11 NOTE — Telephone Encounter (Signed)
Please schedule patient an appt to for f/u and labs for any further refills. 30 Day supply has been sent

## 2020-05-11 NOTE — Telephone Encounter (Signed)
Spoke with pt - he is out of town/out of state for the next three months. Pt scheduled next appt in Sept and would like to request a 90 day refill for lipitor instead of the 30 day supply already sent to his pharmacy. Please advise

## 2020-05-11 NOTE — Telephone Encounter (Signed)
Spoke with pt and scheduled appt in sept due to pt being out of state for the next few months

## 2020-05-12 ENCOUNTER — Encounter: Payer: Self-pay | Admitting: Registered Nurse

## 2020-07-01 ENCOUNTER — Other Ambulatory Visit: Payer: Self-pay | Admitting: Registered Nurse

## 2020-07-16 ENCOUNTER — Encounter: Payer: Self-pay | Admitting: Pulmonary Disease

## 2020-07-16 ENCOUNTER — Ambulatory Visit: Payer: PPO | Admitting: Pulmonary Disease

## 2020-07-16 ENCOUNTER — Other Ambulatory Visit: Payer: Self-pay

## 2020-07-16 VITALS — BP 120/80 | HR 70 | Temp 97.7°F | Ht 70.5 in | Wt 203.4 lb

## 2020-07-16 DIAGNOSIS — J4540 Moderate persistent asthma, uncomplicated: Secondary | ICD-10-CM | POA: Diagnosis not present

## 2020-07-16 NOTE — Patient Instructions (Addendum)
Glad you are doing well with regard to your breathing Continue your Symbicort inhaler You can increase the dose to 2 puffs twice daily and use extra doses as needed when you have your breathing worsens Continue the nasal spray, antiallergy medication  Follow-up in 1 year  Notification of test results are managed in the following manner: If there are  any recommendations or changes to the  plan of care discussed in office today,  we will contact you and let you know what they are. If you do not hear from [place], then your results are normal and you can view them through your  MyChart account , or a letter will be sent to you. Thank you again for trusting [place] with your care, [facility].

## 2020-07-16 NOTE — Progress Notes (Signed)
Carl Henry    [id number]    1946/04/04  Primary Care Physician:Morrow, Delfino Lovett, NP  Referring Physician: Maximiano Coss, NP [place],  [place] [address]  Chief complaint: Follow-up for asthma  HPI: 74 year old with history of hyperlipidemia, allergies, chronic rhinosinusitis with nasal polyps Complains of recurrent attacks of chronic bronchitis, cough since January 2020.  Also suffered broken ribs from a fall, excessive coughing. In January he was treated with prednisone for 10 days and given an albuterol inhaler.  He states that his symptoms improved while on steroids but recur once he stops the treatment.  Seen in ED on 9/2 for cough, dyspnea with normal chest x-ray.  Labs noted for significant peripheral eosinophilia.  He has history of chronic rhinosinusitis and nasal polyps status post sinus surgery in 2005.  Seen by Dr. Valetta Mole, ENT in 2018  for swollen uvula.  Findings consistent with GERD and PPI therapy recommended.  Pets: No pets Occupation: Used to work for occupational therapy.  Currently retired Exposures: No known exposures.  No mold, hot tub, Jacuzzi Smoking history: Never smoker Travel history: Originally from [place].  No significant recent travel Relevant family history: Grandfather had COPD.  He was a smoker.  Interim history:  Had mild exacerbation around June 2021 which improved with increased dose of Symbicort He is now back to 2 puffs once a day with good control of symptoms.  Hoarseness of voice has improved with spacer device  Continues on over-the-counter steroid nasal spray, Claritin and Singulair  Outpatient Encounter Medications as of 07/16/2020  Medication Sig  . aspirin 81 MG tablet Take 81 mg by mouth 3 (three) times a week. Mon, Wed, Fri  . atorvastatin (LIPITOR) 20 MG tablet TAKE 1 TABLET(20 MG) BY MOUTH DAILY  . budesonide-formoterol (SYMBICORT) 80-4.5 MCG/ACT inhaler Inhale 1 puff into the lungs 2 (two) times  daily.  [name] glucosamine-chondroitin 500-400 MG tablet Take 1 tablet by mouth daily.  [name] loratadine (CLARITIN) 10 MG tablet Take 1 tablet (10 mg total) by mouth daily. 2 times daily  . montelukast (SINGULAIR) 10 MG tablet Take 1 tablet (10 mg total) by mouth at bedtime.  [name] omeprazole (PRILOSEC) 40 MG capsule TAKE 1 CAPSULE BY MOUTH EVERY DAY BEFORE DINNER  . Spacer/Aero-Hold Chamber Bags MISC 1 Device by Does not apply route as directed.  . triamcinolone (NASACORT ALLERGY 24HR) 55 MCG/ACT AERO nasal inhaler Place 2 sprays into the nose daily. As needed   No facility-administered encounter medications on file as of 07/16/2020.    Allergies as of 07/16/2020  . (No Known Allergies)   Physical Exam: Blood pressure 120/80, pulse 70, temperature 97.7 F (36.5 C), temperature source Temporal, height 5' 10.5" (1.791 m), weight 203 lb 6.4 oz (92.3 kg), SpO2 98 %. Gen:      No acute distress HEENT:  EOMI, sclera anicteric Neck:     No masses; no thyromegaly Lungs:    Clear to auscultation bilaterally; normal respiratory effort CV:         Regular rate and rhythm; no murmurs Abd:      + bowel sounds; soft, non-tender; no palpable masses, no distension Ext:    No edema; adequate peripheral perfusion Skin:      Warm and dry; no rash Neuro: alert and oriented x 3 Psych: normal mood and affect  Data Reviewed: Imaging: CT chest 12/25/2018- no evidence of interstitial lung disease.  Minimal bibasilar atelectasis Chest x-ray 07/15/19- minimal bibasilar opacities.  I have reviewed the images personally.  PFTs: 09/12/19-FVC 4.21 [92%], FEV1 3.58 [106%), F/F 85, TLC 7.34 [101%], DLCO 28.13 [105%) Minimal obstruction  Act score 03/22/2020-24 Act score 07/16/2020- 23  Labs: WBC 07/16/2019-WBC 7.5, eos 7%, absolute eosinophil count 525 Respiratory allergy profile 07/23/2019-IgE 45, RAST panel negative SARS-CoV-2 04/26/2019-negative  Assessment:  Moderate persistent asthma Likely has asthma, reactive airway  disease given history of sinus issues, GERD, elevated peripheral eosinophils PFTs reviewed with no overt obstruction but curvature to flow loop in FEV1 and mid flow rates suggestive of reactive airways  Continue Symbicort with spacer once daily Discussed SMART strategy with patient.  He knows to escalate up Symbicort and use it as needed whenever his breathing worsens  Chronic sinusitis Continue steroid nasal spray, Singulair and Claritin  Acid reflux Continue Prilosec  Plan/Recommendations: - Symbicort - Steroid nasal spray, Singulair, Claritin  Marshell Garfinkel MD [facility] 07/16/2020, 8:50 AM  CC: Maximiano Coss, NP

## 2020-07-22 ENCOUNTER — Encounter: Payer: Self-pay | Admitting: Registered Nurse

## 2020-07-22 ENCOUNTER — Other Ambulatory Visit: Payer: Self-pay

## 2020-07-22 ENCOUNTER — Ambulatory Visit (INDEPENDENT_AMBULATORY_CARE_PROVIDER_SITE_OTHER): Payer: PPO | Admitting: Registered Nurse

## 2020-07-22 VITALS — BP 119/71 | HR 60 | Temp 98.2°F | Resp 17 | Ht 70.5 in | Wt 198.4 lb

## 2020-07-22 DIAGNOSIS — Z0001 Encounter for general adult medical examination with abnormal findings: Secondary | ICD-10-CM | POA: Diagnosis not present

## 2020-07-22 DIAGNOSIS — Z0000 Encounter for general adult medical examination without abnormal findings: Secondary | ICD-10-CM

## 2020-07-22 DIAGNOSIS — E7800 Pure hypercholesterolemia, unspecified: Secondary | ICD-10-CM

## 2020-07-22 DIAGNOSIS — K219 Gastro-esophageal reflux disease without esophagitis: Secondary | ICD-10-CM

## 2020-07-22 DIAGNOSIS — M62830 Muscle spasm of back: Secondary | ICD-10-CM | POA: Diagnosis not present

## 2020-07-22 MED ORDER — OMEPRAZOLE 40 MG PO CPDR: DELAYED_RELEASE_CAPSULE | ORAL | 3 refills | Status: DC

## 2020-07-22 MED ORDER — LIDOCAINE 5 % EX PTCH: 1.0000 | MEDICATED_PATCH | CUTANEOUS | 0 refills | Status: AC

## 2020-07-22 MED ORDER — LIDOCAINE 5 % EX PTCH: 1.0000 | MEDICATED_PATCH | CUTANEOUS | Status: DC

## 2020-07-22 NOTE — Progress Notes (Signed)
Established Patient Office Visit  Subjective:  [name], male    DOB: 1946-10-21  Age: 74 y.o. MRN: [id number]  CC:  Chief Complaint  Patient presents with  . Medication Refill    patient states he possibly needs a refill on Omeprazole and Labs. Per patient he would like to discuss bone density testing,    HPI Carl Henry presents for CPE and labs.  Needs refill on omeprazole. GERD well controlled.   Curious about bone density scan - does he need one? Believes he had had something like this in the past - no records found. No hx of Vit D or calcium deficiency. No history of pathological fracture.  Some lower back pain on R side. Tweaked it while lifting something at home. No radiation from lower extremities. No numbness, weakness, tingling. No saddle symptoms. No headache visual changes shob doe or other cns symptoms  Otherwise no concerns.  Past Medical History:  Diagnosis Date  . Allergic rhinitis 02/17/2014  . Allergy   . GERD (gastroesophageal reflux disease)   . Hyperlipidemia   . HYPERLIPIDEMIA 01/11/2007   Qualifier: Diagnosis of  By: Beryle Lathe    . 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 Nasal polyps   . Pure hypercholesterolemia 02/17/2014  . RHINITIS, ALLERGIC 01/11/2007   08/05/2016.  [place].  [name], MD.  Patient presents for evaluation with intermittent acute uvular swelling, he has had 2 episodes last 3 mths.  Etiology unclear, no anatomic abnormalities.  Patient is not on an ACE inhibitor and no hx of angioedema.  We discussed possible etiologies including atypical reflux with irritation and swelling along with heavy snoring with edema    Past Surgical History:  Procedure Laterality Date  . COLONOSCOPY  04/09/13   Two polyps.  Outlaw/[facility].  Repeat 5 years.  [name] NASAL POLYP SURGERY    . NASAL SINUS SURGERY  11/15/2003   x 3.  [name].    Family History  Problem Relation Age of Onset  . Osteoporosis Mother    . Hyperlipidemia Father   . Hypertension Father   . Arthritis Father   . Depression Father   . COPD Sister   . COPD Maternal Grandfather     Social History   Socioeconomic History  . Marital status: Married    Spouse name: Jan  . Number of children: 3  . Years of education: Not on file  . Highest education level: Not on file  Occupational History  . Occupation: Retired    Fish farm manager: RETIRED    Comment: Warden/ranger  Tobacco Use  . Smoking status: Never Smoker  . Smokeless tobacco: Never Used  Vaping Use  . Vaping Use: Never used  Substance and Sexual Activity  . Alcohol use: No    Alcohol/week: 0.0 standard drinks  . Drug use: No  . Sexual activity: Yes    Partners: Female    Birth control/protection: Post-menopausal  Other Topics Concern  . Not on file  Social History Narrative   Marital status: married x 46 years.      Children: 3 children (twin sons); 6 grandchildren.      Lives: with wife, father.      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      Tobacco: none      Alcohol: none currently; stopped several years ago.  Exercise: walking daily; goes to [employer] five days per week in 2018.         Seatbelt: 100%      ADLs: independent with all ADLs; drives; no assistant devices for ambulation.      Advance Directives: +LIVING WILL; FULL CODE; no prolonged measures.         Social Determinants of Health   Financial Resource Strain:   . Difficulty of Paying Living Expenses: Not on file  Food Insecurity:   . Worried About Charity fundraiser in the Last Year: Not on file  . Ran Out of Food in the Last Year: Not on file  Transportation Needs:   . Lack of Transportation (Medical): Not on file  . Lack of Transportation (Non-Medical): Not on file  Physical Activity:   . Days of Exercise per Week: Not on file  . Minutes of Exercise per Session: Not on file   Stress:   . Feeling of Stress : Not on file  Social Connections:   . Frequency of Communication with Friends and Family: Not on file  . Frequency of Social Gatherings with Friends and Family: Not on file  . Attends Religious Services: Not on file  . Active Member of Clubs or Organizations: Not on file  . Attends Archivist Meetings: Not on file  . Marital Status: Not on file  Intimate Partner Violence:   . Fear of Current or Ex-Partner: Not on file  . Emotionally Abused: Not on file  . Physically Abused: Not on file  . Sexually Abused: Not on file    Outpatient Medications Prior to Visit  Medication Sig Dispense Refill  . aspirin 81 MG tablet Take 81 mg by mouth 3 (three) times a week. Mon, Wed, Fri    . atorvastatin (LIPITOR) 20 MG tablet TAKE 1 TABLET(20 MG) BY MOUTH DAILY 90 tablet 0  . budesonide-formoterol (SYMBICORT) 80-4.5 MCG/ACT inhaler Inhale 1 puff into the lungs 2 (two) times daily. 1 Inhaler 5  . glucosamine-chondroitin 500-400 MG tablet Take 1 tablet by mouth daily.    [name] loratadine (CLARITIN) 10 MG tablet Take 1 tablet (10 mg total) by mouth daily. 2 times daily 90 tablet 0  . montelukast (SINGULAIR) 10 MG tablet Take 1 tablet (10 mg total) by mouth at bedtime. 90 tablet 1  . Spacer/Aero-Hold Chamber Bags MISC 1 Device by Does not apply route as directed. 1 Device 0  . triamcinolone (NASACORT ALLERGY 24HR) 55 MCG/ACT AERO nasal inhaler Place 2 sprays into the nose daily. As needed    . omeprazole (PRILOSEC) 40 MG capsule TAKE 1 CAPSULE BY MOUTH EVERY DAY BEFORE DINNER 90 capsule 0   No facility-administered medications prior to visit.    Allergies  Allergen Reactions  . Latex Swelling    OF HANDS    ROS Review of Systems  Constitutional: Negative.   HENT: Negative.   Eyes: Negative.   Respiratory: Negative.   Cardiovascular: Negative.   Gastrointestinal: Negative.   Endocrine: Negative.   Genitourinary: Negative.   Musculoskeletal: Positive for  back pain. Negative for arthralgias, gait problem, joint swelling, myalgias, neck pain and neck stiffness.  Skin: Negative.   Allergic/Immunologic: Negative.   Neurological: Negative.   Hematological: Negative.   Psychiatric/Behavioral: Negative.   All other systems reviewed and are negative.     Objective:    Physical Exam Vitals and nursing note reviewed.  Constitutional:      General: He is not in acute distress.  Appearance: Normal appearance. He is normal weight. He is not ill-appearing, toxic-appearing or diaphoretic.  HENT:     Head: Normocephalic and atraumatic.     Right Ear: Tympanic membrane, ear canal and external ear normal. There is no impacted cerumen.     Left Ear: Tympanic membrane, ear canal and external ear normal. There is no impacted cerumen.     Nose: Nose normal. No congestion or rhinorrhea.     Mouth/Throat:     Mouth: Mucous membranes are moist.     Pharynx: Oropharynx is clear. No oropharyngeal exudate or posterior oropharyngeal erythema.  Eyes:     General: No scleral icterus.       Right eye: No discharge.        Left eye: No discharge.     Extraocular Movements: Extraocular movements intact.     Conjunctiva/sclera: Conjunctivae normal.     Pupils: Pupils are equal, round, and reactive to light.  Neck:     Vascular: No carotid bruit.  Cardiovascular:     Rate and Rhythm: Normal rate and regular rhythm.     Pulses: Normal pulses.     Heart sounds: Normal heart sounds. No murmur heard.  No friction rub. No gallop.   Pulmonary:     Effort: Pulmonary effort is normal. No respiratory distress.     Breath sounds: Normal breath sounds. No stridor. No wheezing, rhonchi or rales.  Chest:     Chest wall: No tenderness.  Abdominal:     General: Abdomen is flat. Bowel sounds are normal. There is no distension.     Palpations: Abdomen is soft. There is no mass.     Tenderness: There is no abdominal tenderness. There is no right CVA tenderness, left CVA  tenderness, guarding or rebound.     Hernia: No hernia is present.  Musculoskeletal:        General: No swelling, tenderness, deformity or signs of injury. Normal range of motion.     Cervical back: Normal range of motion and neck supple. No rigidity or tenderness.     Right lower leg: No edema.     Left lower leg: No edema.  Lymphadenopathy:     Cervical: No cervical adenopathy.  Skin:    General: Skin is warm and dry.     Capillary Refill: Capillary refill takes less than 2 seconds.     Coloration: Skin is not jaundiced or pale.     Findings: No bruising, erythema, lesion or rash.  Neurological:     General: No focal deficit present.     Mental Status: He is alert and oriented to person, place, and time. Mental status is at baseline.     Cranial Nerves: No cranial nerve deficit.     Motor: No weakness.     Gait: Gait normal.  Psychiatric:        Mood and Affect: Mood normal.        Behavior: Behavior normal.        Thought Content: Thought content normal.        Judgment: Judgment normal.     BP 119/71   Pulse 60   Temp 98.2 F (36.8 C) (Temporal)   Resp 17   Ht 5' 10.5" (1.791 m)   Wt 198 lb 6.4 oz (90 kg)   SpO2 96%   BMI 28.07 kg/m  Wt Readings from Last 3 Encounters:  07/22/20 198 lb 6.4 oz (90 kg)  07/16/20 203 lb 6.4 oz (92.3 kg)  05/06/20 196 lb 12.8 oz (89.3 kg)     There are no preventive care reminders to display for this patient.  There are no preventive care reminders to display for this patient.  Lab Results  Component Value Date   TSH 1.900 12/13/2019   Lab Results  Component Value Date   WBC 7.0 12/13/2019   HGB 15.8 12/13/2019   HCT 49.3 12/13/2019   MCV 85 12/13/2019   PLT 277 07/16/2019   Lab Results  Component Value Date   NA 138 12/13/2019   K 4.4 12/13/2019   CO2 22 12/13/2019   GLUCOSE 95 12/13/2019   BUN 16 12/13/2019   CREATININE 0.98 12/13/2019   BILITOT 0.5 12/13/2019   ALKPHOS 91 12/13/2019   AST 21 12/13/2019   ALT  15 12/13/2019   PROT 6.6 12/13/2019   ALBUMIN 4.4 12/13/2019   CALCIUM 9.6 12/13/2019   ANIONGAP 8 12/25/2018   Lab Results  Component Value Date   CHOL 176 12/13/2019   Lab Results  Component Value Date   HDL 42 12/13/2019   Lab Results  Component Value Date   LDLCALC 117 (H) 12/13/2019   Lab Results  Component Value Date   TRIG 89 12/13/2019   Lab Results  Component Value Date   CHOLHDL 4.2 12/13/2019   Lab Results  Component Value Date   HGBA1C 5.8 (H) 12/13/2019      Assessment & Plan:   Problem List Items Addressed This Visit      Respiratory   Laryngopharyngeal reflux - Primary   Relevant Medications   omeprazole (PRILOSEC) 40 MG capsule   Other Relevant Orders   CBC With Differential   Comprehensive metabolic panel   Lipid panel   Hemoglobin A1c     Other   Pure hypercholesterolemia   Relevant Orders   CBC With Differential   Comprehensive metabolic panel   Lipid panel   Hemoglobin A1c      Meds ordered this encounter  Medications  . omeprazole (PRILOSEC) 40 MG capsule    Sig: TAKE 1 CAPSULE BY MOUTH EVERY DAY BEFORE DINNER    Dispense:  90 capsule    Refill:  3    Order Specific Question:   Supervising Provider    Answer:   Carlota Raspberry, JEFFREY R [[id number]]    Follow-up: No follow-ups on file.   PLAN  Exam unremarkable  No indication for bone density scan  Labs collected, will follow up as warranted  Refill omeprazole x 1 year  Patient encouraged to call clinic with any questions, comments, or concerns.  Maximiano Coss, NP

## 2020-07-22 NOTE — Patient Instructions (Signed)
° ° ° °  If you have lab work done today you will be contacted with your lab results within the next 2 weeks.  If you have not heard from us then please contact us. The fastest way to get your results is to register for My Chart. ° ° °IF you received an x-ray today, you will receive an invoice from [facility]. Please contact [facility] at [phone] with questions or concerns regarding your invoice.  ° °IF you received labwork today, you will receive an invoice from [facility]. Please contact [facility] at [phone] with questions or concerns regarding your invoice.  ° °Our billing staff will not be able to assist you with questions regarding bills from these companies. ° °You will be contacted with the lab results as soon as they are available. The fastest way to get your results is to activate your My Chart account. Instructions are located on the last page of this paperwork. If you have not heard from us regarding the results in 2 weeks, please contact this office. °  ° ° ° °

## 2020-07-23 ENCOUNTER — Encounter: Payer: Self-pay | Admitting: *Deleted

## 2020-07-23 ENCOUNTER — Telehealth: Payer: Self-pay | Admitting: *Deleted

## 2020-07-23 LAB — CBC WITH DIFFERENTIAL
Basophils Absolute: 0.1 10*3/uL (ref 0.0–0.2)
Basos: 1 %
EOS (ABSOLUTE): 0.4 10*3/uL (ref 0.0–0.4)
Eos: 7 %
Hematocrit: 46.6 % (ref 37.5–51.0)
Hemoglobin: 15.5 g/dL (ref 13.0–17.7)
Immature Grans (Abs): 0 10*3/uL (ref 0.0–0.1)
Immature Granulocytes: 0 %
Lymphocytes Absolute: 1.3 10*3/uL (ref 0.7–3.1)
Lymphs: 21 %
MCH: 28.8 pg (ref 26.6–33.0)
MCHC: 33.3 g/dL (ref 31.5–35.7)
MCV: 87 fL (ref 79–97)
Monocytes Absolute: 0.5 10*3/uL (ref 0.1–0.9)
Monocytes: 8 %
Neutrophils Absolute: 4.1 10*3/uL (ref 1.4–7.0)
Neutrophils: 63 %
RBC: 5.38 x10E6/uL (ref 4.14–5.80)
RDW: 13.3 % (ref 11.6–15.4)
WBC: 6.5 10*3/uL (ref 3.4–10.8)

## 2020-07-23 LAB — COMPREHENSIVE METABOLIC PANEL
ALT: 16 IU/L (ref 0–44)
AST: 20 IU/L (ref 0–40)
Albumin/Globulin Ratio: 1.7 (ref 1.2–2.2)
Albumin: 4.3 g/dL (ref 3.7–4.7)
Alkaline Phosphatase: 105 IU/L (ref 48–121)
BUN/Creatinine Ratio: 15 (ref 10–24)
BUN: 13 mg/dL (ref 8–27)
Bilirubin Total: 0.9 mg/dL (ref 0.0–1.2)
CO2: 21 mmol/L (ref 20–29)
Calcium: 9.6 mg/dL (ref 8.6–10.2)
Chloride: 103 mmol/L (ref 96–106)
Creatinine, Ser: 0.86 mg/dL (ref 0.76–1.27)
GFR calc Af Amer: 100 mL/min/{1.73_m2} (ref >59)
GFR calc non Af Amer: 87 mL/min/{1.73_m2} (ref >59)
Globulin, Total: 2.5 g/dL (ref 1.5–4.5)
Glucose: 93 mg/dL (ref 65–99)
Potassium: 4.5 mmol/L (ref 3.5–5.2)
Sodium: 141 mmol/L (ref 134–144)
Total Protein: 6.8 g/dL (ref 6.0–8.5)

## 2020-07-23 LAB — LIPID PANEL
Chol/HDL Ratio: 4 ratio (ref 0.0–5.0)
Cholesterol, Total: 176 mg/dL (ref 100–199)
HDL: 44 mg/dL (ref >39)
LDL Chol Calc (NIH): 114 mg/dL — ABNORMAL HIGH (ref 0–99)
Triglycerides: 96 mg/dL (ref 0–149)
VLDL Cholesterol Cal: 18 mg/dL (ref 5–40)

## 2020-07-23 LAB — HEMOGLOBIN A1C
Est. average glucose Bld gHb Est-mCnc: 123 mg/dL
Hgb A1c MFr Bld: 5.9 % — ABNORMAL HIGH (ref 4.8–5.6)

## 2020-07-23 NOTE — Telephone Encounter (Signed)
Lidocaine 5% patches     Status: PA Response - Approved  Patient informed

## 2020-07-24 ENCOUNTER — Encounter: Payer: Self-pay | Admitting: Registered Nurse

## 2020-08-10 ENCOUNTER — Other Ambulatory Visit: Payer: Self-pay | Admitting: Registered Nurse

## 2020-08-10 DIAGNOSIS — E7800 Pure hypercholesterolemia, unspecified: Secondary | ICD-10-CM

## 2020-08-10 NOTE — Telephone Encounter (Signed)
Requested Prescriptions  Pending Prescriptions Disp Refills  . atorvastatin (LIPITOR) 20 MG tablet [Pharmacy Med Name: ATORVASTATIN 20MG  TABLETS] 90 tablet 1    Sig: TAKE 1 TABLET(20 MG) BY MOUTH DAILY     Cardiovascular:  Antilipid - Statins Failed - 08/10/2020  1:51 PM      Failed - LDL in normal range and within 360 days    LDL Chol Calc (NIH)  Date Value Ref Range Status  07/22/2020 114 (H) 0 - 99 mg/dL Final         Passed - Total Cholesterol in normal range and within 360 days    Cholesterol, Total  Date Value Ref Range Status  07/22/2020 176 100 - 199 mg/dL Final         Passed - HDL in normal range and within 360 days    HDL  Date Value Ref Range Status  07/22/2020 44 >39 mg/dL Final         Passed - Triglycerides in normal range and within 360 days    Triglycerides  Date Value Ref Range Status  07/22/2020 96 0 - 149 mg/dL Final         Passed - Patient is not pregnant      Passed - Valid encounter within last 12 months    Recent Outpatient Visits          2 weeks ago Annual physical exam   [facility], Delfino Lovett, NP   8 months ago Primary osteoarthritis of left shoulder   [facility], Delfino Lovett, NP   1 year ago DOE (dyspnea on exertion)   [facility], Delfino Lovett, NP   1 year ago Cough   [facility], Delfino Lovett, NP   1 year ago Medicare annual wellness visit, subsequent   [facility], Lilia Argue, MD      Future Appointments            In 6 months Maximiano Coss, NP [facility] [place], [facility]

## 2020-09-22 ENCOUNTER — Encounter: Payer: Self-pay | Admitting: Registered Nurse

## 2020-09-25 ENCOUNTER — Other Ambulatory Visit: Payer: Self-pay

## 2020-09-25 ENCOUNTER — Ambulatory Visit (INDEPENDENT_AMBULATORY_CARE_PROVIDER_SITE_OTHER): Payer: PPO | Admitting: Registered Nurse

## 2020-09-25 ENCOUNTER — Encounter: Payer: Self-pay | Admitting: Registered Nurse

## 2020-09-25 ENCOUNTER — Other Ambulatory Visit: Payer: Self-pay | Admitting: Registered Nurse

## 2020-09-25 VITALS — BP 134/76 | HR 62 | Temp 97.9°F | Resp 17 | Ht 70.5 in | Wt 197.0 lb

## 2020-09-25 DIAGNOSIS — K219 Gastro-esophageal reflux disease without esophagitis: Secondary | ICD-10-CM

## 2020-09-25 DIAGNOSIS — K4090 Unilateral inguinal hernia, without obstruction or gangrene, not specified as recurrent: Secondary | ICD-10-CM

## 2020-09-25 NOTE — Patient Instructions (Signed)
° ° ° °  If you have lab work done today you will be contacted with your lab results within the next 2 weeks.  If you have not heard from us then please contact us. The fastest way to get your results is to register for My Chart. ° ° °IF you received an x-ray today, you will receive an invoice from [facility]. Please contact [facility] at [phone] with questions or concerns regarding your invoice.  ° °IF you received labwork today, you will receive an invoice from [facility]. Please contact [facility] at [phone] with questions or concerns regarding your invoice.  ° °Our billing staff will not be able to assist you with questions regarding bills from these companies. ° °You will be contacted with the lab results as soon as they are available. The fastest way to get your results is to activate your My Chart account. Instructions are located on the last page of this paperwork. If you have not heard from us regarding the results in 2 weeks, please contact this office. °  ° ° ° °

## 2020-09-26 ENCOUNTER — Encounter: Payer: Self-pay | Admitting: Registered Nurse

## 2020-09-26 NOTE — Progress Notes (Signed)
Acute Office Visit  Subjective:    [name], male    DOB: 1946/09/26, 74 y.o.   MRN: [id number]  Chief Complaint  Patient presents with  . Hernia    Patient states this week he noticed something that looked like a possible hernia on the right side. Per patient he is able to push it in a little and its not painful but just need to get it checked out,    HPI Patient is in today for possible hernia  Noticed within the past week that R side of groin there was a swollen area. Nontender. No redness. Unsure of onset or if it was there and recently worsened.   Denies changes to bowel or bladder habits. No nvd. No fevers, chills, sweats, fatigue.  Past Medical History:  Diagnosis Date  . Allergic rhinitis 02/17/2014  . Allergy   . GERD (gastroesophageal reflux disease)   . Hyperlipidemia   . HYPERLIPIDEMIA 01/11/2007   Qualifier: Diagnosis of  By: Beryle Lathe    . 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 Nasal polyps   . Pure hypercholesterolemia 02/17/2014  . RHINITIS, ALLERGIC 01/11/2007   08/05/2016.  [place].  [name], MD.  Patient presents for evaluation with intermittent acute uvular swelling, he has had 2 episodes last 3 mths.  Etiology unclear, no anatomic abnormalities.  Patient is not on an ACE inhibitor and no hx of angioedema.  We discussed possible etiologies including atypical reflux with irritation and swelling along with heavy snoring with edema    Past Surgical History:  Procedure Laterality Date  . COLONOSCOPY  04/09/13   Two polyps.  Outlaw/[facility].  Repeat 5 years.  [name] NASAL POLYP SURGERY    . NASAL SINUS SURGERY  11/15/2003   x 3.  [name].    Family History  Problem Relation Age of Onset  . Osteoporosis Mother   . Hyperlipidemia Father   . Hypertension Father   . Arthritis Father   . Depression Father   . COPD Sister   . COPD Maternal Grandfather     Social History   Socioeconomic History  . Marital status:  Married    Spouse name: Jan  . Number of children: 3  . Years of education: Not on file  . Highest education level: Not on file  Occupational History  . Occupation: Retired    Fish farm manager: RETIRED    Comment: Warden/ranger  Tobacco Use  . Smoking status: Never Smoker  . Smokeless tobacco: Never Used  Vaping Use  . Vaping Use: Never used  Substance and Sexual Activity  . Alcohol use: No    Alcohol/week: 0.0 standard drinks  . Drug use: No  . Sexual activity: Yes    Partners: Female    Birth control/protection: Post-menopausal  Other Topics Concern  . Not on file  Social History Narrative   Marital status: married x 46 years.      Children: 3 children (twin sons); 6 grandchildren.      Lives: with wife, father.      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      Tobacco: none      Alcohol: none currently; stopped several years ago.       Exercise: walking daily; goes to [employer] five days per week in 2018.         Seatbelt: 100%  ADLs: independent with all ADLs; drives; no assistant devices for ambulation.      Advance Directives: +LIVING WILL; FULL CODE; no prolonged measures.         Social Determinants of Health   Financial Resource Strain:   . Difficulty of Paying Living Expenses: Not on file  Food Insecurity:   . Worried About Charity fundraiser in the Last Year: Not on file  . Ran Out of Food in the Last Year: Not on file  Transportation Needs:   . Lack of Transportation (Medical): Not on file  . Lack of Transportation (Non-Medical): Not on file  Physical Activity:   . Days of Exercise per Week: Not on file  . Minutes of Exercise per Session: Not on file  Stress:   . Feeling of Stress : Not on file  Social Connections:   . Frequency of Communication with Friends and Family: Not on file  . Frequency of Social Gatherings with Friends and Family: Not on file  .  Attends Religious Services: Not on file  . Active Member of Clubs or Organizations: Not on file  . Attends Archivist Meetings: Not on file  . Marital Status: Not on file  Intimate Partner Violence:   . Fear of Current or Ex-Partner: Not on file  . Emotionally Abused: Not on file  . Physically Abused: Not on file  . Sexually Abused: Not on file    Outpatient Medications Prior to Visit  Medication Sig Dispense Refill  . aspirin 81 MG tablet Take 81 mg by mouth 2 (two) times a week. Mon, Wed, Fri     . atorvastatin (LIPITOR) 20 MG tablet TAKE 1 TABLET(20 MG) BY MOUTH DAILY 90 tablet 1  . budesonide-formoterol (SYMBICORT) 80-4.5 MCG/ACT inhaler Inhale 1 puff into the lungs 2 (two) times daily. 1 Inhaler 5  . glucosamine-chondroitin 500-400 MG tablet Take 1 tablet by mouth daily.    [name] lidocaine (LIDODERM) 5 % Place 1 patch onto the skin daily. Remove & Discard patch within 12 hours or as directed by MD 30 patch 0  . loratadine (CLARITIN) 10 MG tablet Take 1 tablet (10 mg total) by mouth daily. 2 times daily 90 tablet 0  . montelukast (SINGULAIR) 10 MG tablet Take 1 tablet (10 mg total) by mouth at bedtime. 90 tablet 1  . Spacer/Aero-Hold Chamber Bags MISC 1 Device by Does not apply route as directed. 1 Device 0  . triamcinolone (NASACORT ALLERGY 24HR) 55 MCG/ACT AERO nasal inhaler Place 2 sprays into the nose daily. As needed    . omeprazole (PRILOSEC) 40 MG capsule TAKE 1 CAPSULE BY MOUTH EVERY DAY BEFORE DINNER 90 capsule 3   No facility-administered medications prior to visit.    Allergies  Allergen Reactions  . Latex Swelling    OF HANDS    Review of Systems Per hpi      Objective:    Physical Exam Vitals and nursing note reviewed.  Constitutional:      Appearance: Normal appearance.  Cardiovascular:     Rate and Rhythm: Normal rate and regular rhythm.  Pulmonary:     Effort: Pulmonary effort is normal. No respiratory distress.     Breath sounds: Normal breath  sounds. No stridor. No wheezing, rhonchi or rales.  Chest:     Chest wall: No tenderness.  Abdominal:     General: Abdomen is flat. Bowel sounds are normal. There is no distension.     Palpations: Abdomen is soft. There  is no mass.     Tenderness: There is no abdominal tenderness.     Hernia: A hernia is present. Hernia is present in the right inguinal area. There is no hernia in the umbilical area, ventral area, left inguinal area, right femoral area or left femoral area.  Skin:    Capillary Refill: Capillary refill takes less than 2 seconds.  Neurological:     General: No focal deficit present.     Mental Status: He is alert and oriented to person, place, and time. Mental status is at baseline.  Psychiatric:        Mood and Affect: Mood normal.        Behavior: Behavior normal.        Thought Content: Thought content normal.        Judgment: Judgment normal.     BP 134/76   Pulse 62   Temp 97.9 F (36.6 C) (Temporal)   Resp 17   Ht 5' 10.5" (1.791 m)   Wt 197 lb (89.4 kg)   SpO2 97%   BMI 27.87 kg/m  Wt Readings from Last 3 Encounters:  09/25/20 197 lb (89.4 kg)  07/22/20 198 lb 6.4 oz (90 kg)  07/16/20 203 lb 6.4 oz (92.3 kg)    There are no preventive care reminders to display for this patient.  There are no preventive care reminders to display for this patient.   Lab Results  Component Value Date   TSH 1.900 12/13/2019   Lab Results  Component Value Date   WBC 6.5 07/22/2020   HGB 15.5 07/22/2020   HCT 46.6 07/22/2020   MCV 87 07/22/2020   PLT 277 07/16/2019   Lab Results  Component Value Date   NA 141 07/22/2020   K 4.5 07/22/2020   CO2 21 07/22/2020   GLUCOSE 93 07/22/2020   BUN 13 07/22/2020   CREATININE 0.86 07/22/2020   BILITOT 0.9 07/22/2020   ALKPHOS 105 07/22/2020   AST 20 07/22/2020   ALT 16 07/22/2020   PROT 6.8 07/22/2020   ALBUMIN 4.3 07/22/2020   CALCIUM 9.6 07/22/2020   ANIONGAP 8 12/25/2018   Lab Results  Component Value Date    CHOL 176 07/22/2020   Lab Results  Component Value Date   HDL 44 07/22/2020   Lab Results  Component Value Date   LDLCALC 114 (H) 07/22/2020   Lab Results  Component Value Date   TRIG 96 07/22/2020   Lab Results  Component Value Date   CHOLHDL 4.0 07/22/2020   Lab Results  Component Value Date   HGBA1C 5.9 (H) 07/22/2020       Assessment & Plan:   Problem List Items Addressed This Visit    None    Visit Diagnoses    Right inguinal hernia    -  Primary   Relevant Orders   Ambulatory referral to General Surgery       No orders of the defined types were placed in this encounter.  PLAN  Apparent R inguinal hernia.   No apparent complications based on pt history  Will refer to gen surg for assessment and options  Patient encouraged to call clinic with any questions, comments, or concerns.   Maximiano Coss, NP

## 2020-10-29 DIAGNOSIS — K4090 Unilateral inguinal hernia, without obstruction or gangrene, not specified as recurrent: Secondary | ICD-10-CM | POA: Diagnosis not present

## 2020-11-18 ENCOUNTER — Encounter: Payer: Self-pay | Admitting: Registered Nurse

## 2020-11-18 DIAGNOSIS — D2271 Melanocytic nevi of right lower limb, including hip: Secondary | ICD-10-CM | POA: Diagnosis not present

## 2020-11-18 DIAGNOSIS — L821 Other seborrheic keratosis: Secondary | ICD-10-CM | POA: Diagnosis not present

## 2020-11-18 DIAGNOSIS — D225 Melanocytic nevi of trunk: Secondary | ICD-10-CM | POA: Diagnosis not present

## 2020-11-18 DIAGNOSIS — D2262 Melanocytic nevi of left upper limb, including shoulder: Secondary | ICD-10-CM | POA: Diagnosis not present

## 2020-11-18 DIAGNOSIS — L738 Other specified follicular disorders: Secondary | ICD-10-CM | POA: Diagnosis not present

## 2020-11-18 DIAGNOSIS — D2272 Melanocytic nevi of left lower limb, including hip: Secondary | ICD-10-CM | POA: Diagnosis not present

## 2020-11-23 ENCOUNTER — Other Ambulatory Visit: Payer: Self-pay

## 2020-11-23 ENCOUNTER — Telehealth (INDEPENDENT_AMBULATORY_CARE_PROVIDER_SITE_OTHER): Payer: PPO | Admitting: Registered Nurse

## 2020-11-23 DIAGNOSIS — B9689 Other specified bacterial agents as the cause of diseases classified elsewhere: Secondary | ICD-10-CM | POA: Diagnosis not present

## 2020-11-23 DIAGNOSIS — J329 Chronic sinusitis, unspecified: Secondary | ICD-10-CM

## 2020-11-23 MED ORDER — AMOXICILLIN-POT CLAVULANATE 875-125 MG PO TABS: 1.0000 | ORAL_TABLET | Freq: Two times a day (BID) | ORAL | 0 refills | Status: DC

## 2020-11-23 NOTE — Patient Instructions (Signed)

## 2020-11-23 NOTE — Progress Notes (Signed)
Telemedicine Encounter- SOAP NOTE Established Patient  This telephone encounter was conducted with the patient's (or proxy's) verbal consent via audio telecommunications: yes  Patient was instructed to have this encounter in a suitably private space; and to only have persons present to whom they give permission to participate. In addition, patient identity was confirmed by use of name plus two identifiers (DOB and address).  I discussed the limitations, risks, security and privacy concerns of performing an evaluation and management service by telephone and the availability of in person appointments. I also discussed with the patient that there may be a patient responsible charge related to this service. The patient expressed understanding and agreed to proceed.  I spent a total of 16 minutes talking with the patient or their proxy.  Patient at home Provider in office  Chief Complaint  Patient presents with  . Sinus Problem    Pt has been having headaches, congestion, mucous, drainage, and some productive coughing with irritation since about 11/14/2020, pt notes Rt side worse than the Lt but both are blocked     Subjective   Carl Henry is a 75 y.o. established patient. Telephone visit today for upper respiratory symptoms  HPI Sinus pressure, pnd, headaches, congestion, cough Ongoing for 10-12 days Has tried a variety of OTC products including mucinex, nasacort, saline rinse Has had sinus infections in the past, feels similar Does not think there are lower respiratory issues occurring   Patient Active Problem List   Diagnosis Date Noted  . Moderate persistent asthma without complication 81/44/8185  . Seasonal allergic rhinitis due to pollen 04/16/2019  . 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 Pure hypercholesterolemia 02/17/2014  . Allergic rhinitis 02/17/2014    Past Medical History:  Diagnosis Date  . Allergic rhinitis 02/17/2014  . Allergy   . GERD (gastroesophageal  reflux disease)   . Hyperlipidemia   . HYPERLIPIDEMIA 01/11/2007   Qualifier: Diagnosis of  By: Beryle Lathe    . 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 Nasal polyps   . Pure hypercholesterolemia 02/17/2014  . RHINITIS, ALLERGIC 01/11/2007   08/05/2016.  [place].  [name], MD.  Patient presents for evaluation with intermittent acute uvular swelling, he has had 2 episodes last 3 mths.  Etiology unclear, no anatomic abnormalities.  Patient is not on an ACE inhibitor and no hx of angioedema.  We discussed possible etiologies including atypical reflux with irritation and swelling along with heavy snoring with edema    Current Outpatient Medications  Medication Sig Dispense Refill  . amoxicillin-clavulanate (AUGMENTIN) 875-125 MG tablet Take 1 tablet by mouth 2 (two) times daily. 14 tablet 0  . aspirin 81 MG tablet Take 81 mg by mouth 2 (two) times a week. Mon, Wed, Fri     . atorvastatin (LIPITOR) 20 MG tablet TAKE 1 TABLET(20 MG) BY MOUTH DAILY 90 tablet 1  . budesonide-formoterol (SYMBICORT) 80-4.5 MCG/ACT inhaler Inhale 1 puff into the lungs 2 (two) times daily. 1 Inhaler 5  . glucosamine-chondroitin 500-400 MG tablet Take 1 tablet by mouth daily.    [name] lidocaine (LIDODERM) 5 % Place 1 patch onto the skin daily. Remove & Discard patch within 12 hours or as directed by MD 30 patch 0  . loratadine (CLARITIN) 10 MG tablet Take 1 tablet (10 mg total) by mouth daily. 2 times daily 90 tablet 0  . montelukast (SINGULAIR) 10 MG tablet Take 1 tablet (10 mg total) by mouth at bedtime. 90 tablet 1  . omeprazole (PRILOSEC)  40 MG capsule TAKE 1 CAPSULE BY MOUTH EVERY DAY BEFORE DINNER 90 capsule 3  . Spacer/Aero-Hold Chamber Bags MISC 1 Device by Does not apply route as directed. 1 Device 0  . triamcinolone (NASACORT) 55 MCG/ACT AERO nasal inhaler Place 2 sprays into the nose daily. As needed     No current facility-administered medications for this visit.    Allergies  Allergen  Reactions  . Latex Swelling    OF HANDS    Social History   Socioeconomic History  . Marital status: Married    Spouse name: Jan  . Number of children: 3  . Years of education: Not on file  . Highest education level: Not on file  Occupational History  . Occupation: Retired    Fish farm manager: RETIRED    Comment: Warden/ranger  Tobacco Use  . Smoking status: Never Smoker  . Smokeless tobacco: Never Used  Vaping Use  . Vaping Use: Never used  Substance and Sexual Activity  . Alcohol use: No    Alcohol/week: 0.0 standard drinks  . Drug use: No  . Sexual activity: Yes    Partners: Female    Birth control/protection: Post-menopausal  Other Topics Concern  . Not on file  Social History Narrative   Marital status: married x 46 years.      Children: 3 children (twin sons); 6 grandchildren.      Lives: with wife, father.      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        Teacher x 5 years.      Tobacco: none      Alcohol: none currently; stopped several years ago.       Exercise: walking daily; goes to [employer] five days per week in 2018.         Seatbelt: 100%      ADLs: independent with all ADLs; drives; no assistant devices for ambulation.      Advance Directives: +LIVING WILL; FULL CODE; no prolonged measures.         Social Determinants of Health   Financial Resource Strain: Not on file  Food Insecurity: Not on file  Transportation Needs: Not on file  Physical Activity: Not on file  Stress: Not on file  Social Connections: Not on file  Intimate Partner Violence: Not on file    ROS Per hpi   Objective   Vitals as reported by the patient: There were no vitals filed for this visit.  Carl Henry was seen today for sinus problem.  Diagnoses and all orders for this visit:  Bacterial sinusitis -     amoxicillin-clavulanate (AUGMENTIN) 875-125 MG tablet; Take 1 tablet by mouth 2 (two) times  daily.   PLAN  augmentin po bid for 7 days  Continue OTC relief  Return precautions given  Patient encouraged to call clinic with any questions, comments, or concerns.  I discussed the assessment and treatment plan with the patient. The patient was provided an opportunity to ask questions and all were answered. The patient agreed with the plan and demonstrated an understanding of the instructions.   The patient was advised to call back or seek an in-person evaluation if the symptoms worsen or if the condition fails to improve as anticipated.  I provided 16 minutes of non-face-to-face time during this encounter.  Maximiano Coss, NP  Primary Care at [facility]

## 2020-11-25 ENCOUNTER — Other Ambulatory Visit: Payer: Self-pay | Admitting: Pulmonary Disease

## 2020-11-26 ENCOUNTER — Other Ambulatory Visit: Payer: Self-pay | Admitting: Pulmonary Disease

## 2021-02-17 ENCOUNTER — Encounter: Payer: Self-pay | Admitting: Registered Nurse

## 2021-06-18 IMAGING — CT CT HEART MORP W/ CTA COR W/ SCORE W/ CA W/CM &/OR W/O CM
1 series · 2 of 4 positions shown, 3 images · non-contrast
Comparison: None.
COMPARISON: None.

Addendum:
EXAM:
OVER-READ INTERPRETATION  CT CHEST

The following report is an over-read performed by radiologist Dr.
Artika Bc [REDACTED] on 08/23/2019. This
over-read does not include interpretation of cardiac or coronary
anatomy or pathology. The coronary calcium score/coronary CTA
interpretation by the cardiologist is attached.
CLINICAL DATA: Chest pain
Cardiac/Coronary CTA
TECHNIQUE: The patient was scanned on a Phillips Force scanner. A 100 kV
prospective scan was triggered in the descending thoracic aorta at
111 HU's. Axial non-contrast 3 mm slices were carried out through
the heart. The data set was analyzed on a dedicated work station and
scored using the Agatson method. Gantry rotation speed was 250 msecs
and collimation was .6 mm. No beta blockade and 0.8 mg of sl NTG was
given. The 3D data set was reconstructed in 5% intervals of the
35-75 % of the R-R cycle. Diastolic phases were analyzed on a
dedicated work station using MPR, MIP and VRT modes. The patient
received 80 cc of contrast.

[Series 217: findings · 2 of 4 slices shown, 3 images]
[im 2/4  vessel]
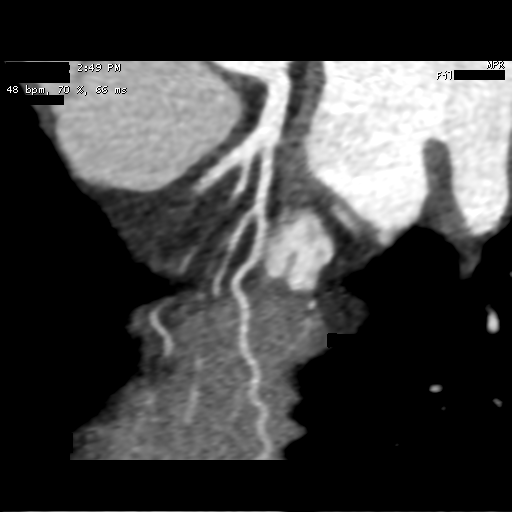
[im 2/4  lung]
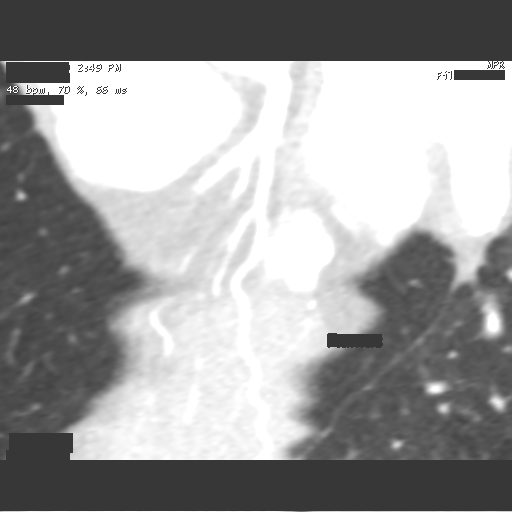
[im 3/4  vessel]
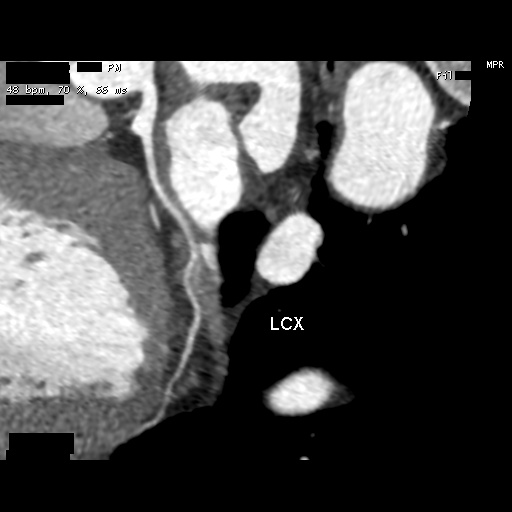

[2 of 4 positions shown; findings below may reference images not displayed]

FINDINGS: Aortic atherosclerosis. Within the visualized portions of the thorax
there are no suspicious appearing pulmonary nodules or masses, there
is no acute consolidative airspace disease, no pleural effusions, no
pneumothorax and no lymphadenopathy. Visualized portions of the
upper abdomen are unremarkable. There are no aggressive appearing
lytic or blastic lesions noted in the visualized portions of the
skeleton. Old healed right-sided rib fracture.
IMPRESSION: 1.  Aortic Atherosclerosis ([id number]-9N1.1).
FINDINGS: Image quality: Excellent.

Noise artifact is: Limited.

Coronary Arteries:  Normal coronary origin.  Right dominance.

Left main: The left main is a large caliber vessel with a normal
take off from the left coronary cusp that trifurcates into a LAD,
LCX, and ramus intermedius. The ostial left main contains minimal
calcified plaque (<25%). The distal left main contains minimal
calcified plaque (<25%).

Left anterior descending artery: The proximal LAD is patent without
plaque or stenosis. The mLAD contains mild calcified plaque
(25-49%). A small mid LAD myocardial bridge is present. The distal
LAD is patent without plaque or stenosis. A small myocardial bridge
is present in the distal LAD. The LAD gives off 2 patent diagonal
branches.

Ramus intermedius: Patent with no evidence of plaque or stenosis.

Left circumflex artery: The LCX is a small non-dominant vessel that
is hypoplastic. The LCX is patent. The LCX gives off 1 patent obtuse
marginal branch.

Right coronary artery: The RCA is a large, dominant vessel with
normal take off from the right coronary cusp. There is no evidence
of plaque or stenosis. The RCA terminates as a PDA and right
posterolateral branch without evidence of plaque or stenosis.

Right Atrium: Right atrial size is within normal limits.

Right Ventricle: The right ventricular cavity is within normal
limits.

Left Atrium: Left atrial size is normal in size with no left atrial
appendage filling defect.

Left Ventricle: The ventricular cavity size is within normal limits.
There are no stigmata of prior infarction. There is no abnormal
filling defect.

Pulmonary arteries: Normal in size without proximal filling defect.

Pulmonary veins: Normal pulmonary venous drainage.

Pericardium: Normal thickness with no significant effusion or
calcium present.

Cardiac valves: The aortic valve is trileaflet without significant
calcification. The mitral valve is normal structure without
significant calcification.

Aorta: Normal caliber.  Mild aortic root calcification.

Extra-cardiac findings: See attached radiology report for
non-cardiac structures.
IMPRESSION: 1. Coronary calcium score of 49. This was 31st percentile for age
and sex matched control.

2. Normal coronary origin with right dominance.

3. Mid and distal LAD myocardial bridges.

4. Minimal left main calcified plaque (<25%)

5. Mild mid LAD calcified plaque (25-49%).

RECOMMENDATIONS:
1. Mild non-obstructive CAD (25-49%). Consider non-atherosclerotic
causes of chest pain. Consider preventive therapy and risk factor
modification.

*** End of Addendum ***
EXAM:
OVER-READ INTERPRETATION  CT CHEST

The following report is an over-read performed by radiologist Dr.
Artika Bc [REDACTED] on 08/23/2019. This
over-read does not include interpretation of cardiac or coronary
anatomy or pathology. The coronary calcium score/coronary CTA
interpretation by the cardiologist is attached.
FINDINGS: Aortic atherosclerosis. Within the visualized portions of the thorax
there are no suspicious appearing pulmonary nodules or masses, there
is no acute consolidative airspace disease, no pleural effusions, no
pneumothorax and no lymphadenopathy. Visualized portions of the
upper abdomen are unremarkable. There are no aggressive appearing
lytic or blastic lesions noted in the visualized portions of the
skeleton. Old healed right-sided rib fracture.
IMPRESSION: 1.  Aortic Atherosclerosis (DRPPL-9N1.1).

## 2021-09-22 ENCOUNTER — Telehealth: Payer: Self-pay | Admitting: Registered Nurse

## 2021-09-22 NOTE — Telephone Encounter (Signed)
Left message for patient to call back and schedule Medicare Annual Wellness Visit (AWV) in office.   If not able to come in office, please offer to do virtually or by telephone.  Left office number and my jabber [phone].  Last AWV:06/27/2019  Please schedule at anytime with Nurse Health Advisor.

## 2022-11-10 ENCOUNTER — Ambulatory Visit (INDEPENDENT_AMBULATORY_CARE_PROVIDER_SITE_OTHER): Payer: No Typology Code available for payment source | Admitting: Nurse Practitioner

## 2022-11-10 ENCOUNTER — Institutional Professional Consult (permissible substitution): Payer: No Typology Code available for payment source | Admitting: Student

## 2022-11-10 ENCOUNTER — Ambulatory Visit (INDEPENDENT_AMBULATORY_CARE_PROVIDER_SITE_OTHER): Payer: No Typology Code available for payment source

## 2022-11-10 ENCOUNTER — Encounter: Payer: Self-pay | Admitting: Nurse Practitioner

## 2022-11-10 VITALS — BP 120/72 | HR 63 | Ht 71.0 in | Wt 209.0 lb

## 2022-11-10 DIAGNOSIS — R051 Acute cough: Secondary | ICD-10-CM

## 2022-11-10 DIAGNOSIS — J301 Allergic rhinitis due to pollen: Secondary | ICD-10-CM | POA: Diagnosis not present

## 2022-11-10 DIAGNOSIS — J4541 Moderate persistent asthma with (acute) exacerbation: Secondary | ICD-10-CM

## 2022-11-10 DIAGNOSIS — J45901 Unspecified asthma with (acute) exacerbation: Secondary | ICD-10-CM

## 2022-11-10 LAB — CBC WITH DIFFERENTIAL/PLATELET
Basophils Absolute: 0.1 10*3/uL (ref 0.0–0.1)
Basophils Relative: 1.1 % (ref 0.0–3.0)
Eosinophils Absolute: 0.6 10*3/uL (ref 0.0–0.7)
Eosinophils Relative: 5.6 % — ABNORMAL HIGH (ref 0.0–5.0)
HCT: 45.9 % (ref 39.0–52.0)
Hemoglobin: 15.6 g/dL (ref 13.0–17.0)
Lymphocytes Relative: 16.6 % (ref 12.0–46.0)
Lymphs Abs: 1.8 10*3/uL (ref 0.7–4.0)
MCHC: 34.1 g/dL (ref 30.0–36.0)
MCV: 86.8 fl (ref 78.0–100.0)
Monocytes Absolute: 0.7 10*3/uL (ref 0.1–1.0)
Monocytes Relative: 7 % (ref 3.0–12.0)
Neutro Abs: 7.5 10*3/uL (ref 1.4–7.7)
Neutrophils Relative %: 69.7 % (ref 43.0–77.0)
Platelets: 280 10*3/uL (ref 150.0–400.0)
RBC: 5.29 Mil/uL (ref 4.22–5.81)
RDW: 14 % (ref 11.5–15.5)
WBC: 10.7 10*3/uL — ABNORMAL HIGH (ref 4.0–10.5)

## 2022-11-10 LAB — POCT EXHALED NITRIC OXIDE: FeNO level (ppb): 133

## 2022-11-10 MED ORDER — METHYLPREDNISOLONE ACETATE 80 MG/ML IJ SUSP
120.0000 mg | Freq: Once | INTRAMUSCULAR | Status: AC
  Administered 2022-11-10: 120 mg via INTRAMUSCULAR

## 2022-11-10 MED ORDER — PREDNISONE 10 MG PO TABS: ORAL_TABLET | ORAL | 0 refills | Status: DC

## 2022-11-10 MED ORDER — IPRATROPIUM-ALBUTEROL 0.5-2.5 (3) MG/3ML IN SOLN
3.0000 mL | Freq: Four times a day (QID) | RESPIRATORY_TRACT | Status: AC | PRN
  Administered 2022-11-10: 3 mL via RESPIRATORY_TRACT

## 2022-11-10 MED ORDER — AZELASTINE HCL 0.1 % NA SOLN: 2.0000 | Freq: Two times a day (BID) | NASAL | 5 refills | Status: AC

## 2022-11-10 NOTE — Patient Instructions (Addendum)
Continue Wixela 1 puff Twice daily. Brush tongue and rinse mouth afterwards Continue Albuterol inhaler 2 puffs or 3 mL neb every 6 hours as needed for shortness of breath or wheezing. Notify if symptoms persist despite rescue inhaler/neb use. Use nebulizer at least twice a day until symptoms improve Restart loratadine (claritin) 1 tab daily for allergies  Continue montelukast (singulair) 1 tab At bedtime  Continue saline lavages/rinses 1-2 times a day before you do use your nasocort spray Continue Nasocort 2 sprays each nostril daily for nasal congestion  Prednisone taper. 4 tabs for 3 days, then 3 tabs for 3 days, 2 tabs for 3 days, then 1 tab for 3 days, then stop. Take in AM with food. Start tomorrow. Astelin nasal spray 2 sprays each nostril daily for nasal congestion  Labs today  Chest x ray didn't show any evidence of pneumonia today   Follow up in 2 weeks with Dr. Vaughan Browner or Katie Cainen Burnham,NP. If symptoms do not improve or worsen, please contact office for sooner follow up or seek emergency care.

## 2022-11-10 NOTE — Progress Notes (Signed)
$'@Patient'h$  ID: Carl Henry, male    DOB: 07-01-1946, 76 y.o.   MRN: [id number]  Chief Complaint  Patient presents with   Follow-up    Pt f/u he is feeling unwell since the beginning of November, [place] dx w/ PNA he finished treatment course, 12/22 started feeling unwell again. Denies fever. Reports nasal congestion, cough, wheezing    Referring provider: Duffy Rhody, MD  HPI: 76 year old male, never smoker followed for asthma. He is a patient of Dr. Matilde Bash and last seen in office 07/16/2020. Past medical history significant for allergic rhinitis, HLD.   TEST/EVENTS:  07/30/2019 echo: EF 60-65%, RV size and function. G1DD 09/12/2019 PFT: FVC 98, FEV1 96, ratio 85, TLC 101, DLCOcor 104. Positive BD  07/16/2020: OV with Dr. Vaughan Browner. Recurrent attacks of chronic bronchitis, cough since January 2020. Had mild exacerbation around June 2021 which improved with increased dose of Symbicort. Hoarseness improved with spacer. Continue nasal sprays, singulair and claritin.  11/10/2022: Today - acute Patient presents today for acute visit with his wife.  Towards the beginning of November, he had URI symptoms and cough and went to the [place].  He was diagnosed with left lower lobe pneumonia and treated with Augmentin as well as a steroid course.  He went back 2 weeks later and was told that his pneumonia had resolved.  He was feeling okay up until the last week.  Started coughing more again, which is primarily dry.  He is also noticed that he is wheezing and having some chest tightness.  Breathing feels about normal.  He does still have nasal congestion and drainage.  He denies any headaches, sore throat, hemoptysis, fevers, chills.  He is currently on [place] for maintenance.  He had come off of all inhalers prior to his diagnosis of pneumonia in November.  Using albuterol a few times since symptoms returned last week. He takes singulair at bedtime. Previously stopped his claritin. Using saline rinses and nasocort  occasionally.  FeNO 133 ppb  Allergies  Allergen Reactions   Latex Swelling    OF HANDS    Immunization History  Administered Date(s) Administered   Fluad Quad(high Dose 65+) 07/23/2019   Hepatitis A 03/22/2010, 03/02/2011   Influenza, High Dose Seasonal PF 07/14/2017, 09/10/2018   Influenza,inj,Quad PF,6+ Mos 08/29/2013, 08/25/2014, 09/21/2015, 09/14/2016   Influenza-Unspecified 08/28/2017   Moderna Sars-Covid-2 Vaccination 12/27/2019, 01/24/2020   Pneumococcal Conjugate-13 08/25/2014   Pneumococcal Polysaccharide-23 08/29/2013   Td 08/14/2004   Tdap 03/04/2008, 12/13/2019    Past Medical History:  Diagnosis Date   Allergic rhinitis 02/17/2014   Allergy    GERD (gastroesophageal reflux disease)    Hyperlipidemia    HYPERLIPIDEMIA 01/11/2007   Qualifier: Diagnosis of  By: Beryle Lathe     Laryngopharyngeal reflux 11/14/2016   Nasal polyps    Pure hypercholesterolemia 02/17/2014   RHINITIS, ALLERGIC 01/11/2007   08/05/2016.  [place].  [name], MD.  Patient presents for evaluation with intermittent acute uvular swelling, he has had 2 episodes last 3 mths.  Etiology unclear, no anatomic abnormalities.  Patient is not on an ACE inhibitor and no hx of angioedema.  We discussed possible etiologies including atypical reflux with irritation and swelling along with heavy snoring with edema    Tobacco History: Social History   Tobacco Use  Smoking Status Never  Smokeless Tobacco Never   Counseling given: Not Answered   Outpatient Medications Prior to Visit  Medication Sig Dispense Refill   aspirin 81  MG tablet Take 81 mg by mouth 2 (two) times a week. Mon, Wed, Fri      atorvastatin (LIPITOR) 20 MG tablet TAKE 1 TABLET(20 MG) BY MOUTH DAILY 90 tablet 1   fluticasone-salmeterol (WIXELA INHUB) 500-50 MCG/ACT AEPB Inhale 1 puff into the lungs in the morning and at bedtime.     glucosamine-chondroitin 500-400 MG tablet Take 1 tablet by mouth daily.      lidocaine (LIDODERM) 5 % Place 1 patch onto the skin daily. Remove & Discard patch within 12 hours or as directed by MD 30 patch 0   loratadine (CLARITIN) 10 MG tablet Take 1 tablet (10 mg total) by mouth daily. 2 times daily 90 tablet 0   montelukast (SINGULAIR) 10 MG tablet Take 1 tablet (10 mg total) by mouth at bedtime. 90 tablet 1   omeprazole (PRILOSEC) 40 MG capsule TAKE 1 CAPSULE BY MOUTH EVERY DAY BEFORE DINNER (Patient taking differently: Take 40 mg by mouth as needed. TAKE 1 CAPSULE BY MOUTH EVERY DAY BEFORE DINNER - pt taking as needed) 90 capsule 3   Spacer/Aero-Hold Chamber Bags MISC 1 Device by Does not apply route as directed. 1 Device 0   triamcinolone (NASACORT) 55 MCG/ACT AERO nasal inhaler Place 2 sprays into the nose daily. As needed     amoxicillin-clavulanate (AUGMENTIN) 875-125 MG tablet Take 1 tablet by mouth 2 (two) times daily. 14 tablet 0   budesonide-formoterol (SYMBICORT) 160-4.5 MCG/ACT inhaler INHALE 2 PUFFS INTO THE LUNGS TWICE DAILY (Patient not taking: Reported on 11/10/2022) 10.2 g 5   budesonide-formoterol (SYMBICORT) 80-4.5 MCG/ACT inhaler Inhale 1 puff into the lungs 2 (two) times daily. (Patient not taking: Reported on 11/10/2022) 1 Inhaler 5   No facility-administered medications prior to visit.     Review of Systems:   Constitutional: No weight loss or gain, night sweats, fevers, chills, fatigue, or lassitude. HEENT: No headaches, difficulty swallowing, tooth/dental problems, or sore throat. No sneezing, itching, ear ache + nasal congestion/drainage, post nasal drip CV:  No chest pain, orthopnea, PND, swelling in lower extremities, anasarca, dizziness, palpitations, syncope Resp: +dry cough, wheezing, chest tightness. No shortness of breath with exertion or at rest. No hemoptysis. No chest wall deformity GI:  No heartburn, indigestion, abdominal pain, loss of appetite GU: No dysuria, change in color of urine, urgency or frequency.   Skin: No rash,  lesions, ulcerations MSK:  No joint pain or swelling.   Neuro: No dizziness or lightheadedness.  Psych: No depression or anxiety. Mood stable.     Physical Exam:  BP 120/72   Pulse 63   Ht '5\' 11"'$  (1.803 m)   Wt 209 lb (94.8 kg)   SpO2 97%   BMI 29.15 kg/m   GEN: Pleasant, interactive, well-appearing; in no acute distress. HEENT:  Normocephalic and atraumatic. PERRLA. Sclera white. Nasal turbinates pink, moist and patent bilaterally. No rhinorrhea present. Oropharynx pink and moist, without exudate or edema. No lesions, ulcerations, or postnasal drip.  NECK:  Supple w/ fair ROM. No JVD present. Normal carotid impulses w/o bruits. Thyroid symmetrical with no goiter or nodules palpated. No lymphadenopathy.   CV: RRR, no m/r/g, no peripheral edema. Pulses intact, +2 bilaterally. No cyanosis, pallor or clubbing. PULMONARY:  Unlabored, regular breathing. Minimal end expiratory wheezes bilaterally A&P. No accessory muscle use.  GI: BS present and normoactive. Soft, non-tender to palpation. No organomegaly or masses detected.  MSK: No erythema, warmth or tenderness. Cap refil <2 sec all extrem. No deformities or joint swelling noted.  Neuro: A/Ox3. No focal deficits noted.   Skin: Warm, no lesions or rashe Psych: Normal affect and behavior. Judgement and thought content appropriate.     Lab Results:  CBC    Component Value Date/Time   WBC 10.7 (H) 11/10/2022 1057   RBC 5.29 11/10/2022 1057   HGB 15.6 11/10/2022 1057   HGB 15.5 07/22/2020 0842   HCT 45.9 11/10/2022 1057   HCT 46.6 07/22/2020 0842   PLT 280.0 11/10/2022 1057   PLT 277 07/16/2019 1540   MCV 86.8 11/10/2022 1057   MCV 87 07/22/2020 0842   MCH 28.8 07/22/2020 0842   MCH 28.4 12/25/2018 1127   MCHC 34.1 11/10/2022 1057   RDW 14.0 11/10/2022 1057   RDW 13.3 07/22/2020 0842   LYMPHSABS 1.8 11/10/2022 1057   LYMPHSABS 1.3 07/22/2020 0842   MONOABS 0.7 11/10/2022 1057   EOSABS 0.6 11/10/2022 1057   EOSABS 0.4  07/22/2020 0842   BASOSABS 0.1 11/10/2022 1057   BASOSABS 0.1 07/22/2020 0842    BMET    Component Value Date/Time   NA 141 07/22/2020 0842   K 4.5 07/22/2020 0842   CL 103 07/22/2020 0842   CO2 21 07/22/2020 0842   GLUCOSE 93 07/22/2020 0842   GLUCOSE 106 (H) 12/25/2018 1127   BUN 13 07/22/2020 0842   CREATININE 0.86 07/22/2020 0842   CREATININE 0.82 04/19/2016 0906   CALCIUM 9.6 07/22/2020 0842   GFRNONAA 87 07/22/2020 0842   GFRNONAA 89 08/25/2014 0900   GFRAA 100 07/22/2020 0842   GFRAA >89 08/25/2014 0900    BNP    Component Value Date/Time   BNP 30.1 07/16/2019 1504     Imaging:  DG Chest 2 View  Result Date: 11/10/2022 CLINICAL DATA:  Recent pneumonia.  Recurrent cough/chest congestion. EXAM: CHEST - 2 VIEW COMPARISON:  Chest x-ray 07/15/2019. FINDINGS: No consolidation. No visible pleural effusions or pneumothorax. Cardiomediastinal silhouette is within normal limits. No acute osseous abnormality. Polyarticular degenerative change. Loose bodies in the left shoulder joint. IMPRESSION: No active cardiopulmonary disease. Electronically Signed   By: Margaretha Sheffield M.D.   On: 11/10/2022 10:31    ipratropium-albuterol (DUONEB) 0.5-2.5 (3) MG/3ML nebulizer solution 3 mL     Date Action Dose Route User   11/10/2022 1040 Given 3 mL Nebulization Priscille Kluver, CMA          Latest Ref Rng & Units 09/12/2019    2:48 PM  PFT Results  FVC-Pre L 4.49   FVC-Predicted Pre % 98   FVC-Post L 4.21   FVC-Predicted Post % 92   Pre FEV1/FVC % % 72   Post FEV1/FCV % % 85   FEV1-Pre L 3.22   FEV1-Predicted Pre % 96   FEV1-Post L 3.58   DLCO uncorrected ml/min/mmHg 28.13   DLCO UNC% % 105   DLCO corrected ml/min/mmHg 27.75   DLCO COR %Predicted % 104   DLVA Predicted % 94   TLC L 7.34   TLC % Predicted % 101   RV % Predicted % 114     No results found for: "NITRICOXIDE"      Assessment & Plan:   Moderate persistent asthmatic bronchitis with  exacerbation Exacerbation of asthmatic bronchitis with elevated exhaled nitric oxide. Depo inj 120 mg x 1 and duoneb in office. We will treat him with prednisone taper. CXR without superimposed infection and resolution of pna so hold off on further abx therapy. Encouraged to remain compliant with maintenance therapy. Asthma action plan in place.  Close follow up. VS stable and non-toxic.  Patient Instructions  Continue Wixela 1 puff Twice daily. Brush tongue and rinse mouth afterwards Continue Albuterol inhaler 2 puffs or 3 mL neb every 6 hours as needed for shortness of breath or wheezing. Notify if symptoms persist despite rescue inhaler/neb use. Use nebulizer at least twice a day until symptoms improve Restart loratadine (claritin) 1 tab daily for allergies  Continue montelukast (singulair) 1 tab At bedtime  Continue saline lavages/rinses 1-2 times a day before you do use your nasocort spray Continue Nasocort 2 sprays each nostril daily for nasal congestion  Prednisone taper. 4 tabs for 3 days, then 3 tabs for 3 days, 2 tabs for 3 days, then 1 tab for 3 days, then stop. Take in AM with food. Start tomorrow. Astelin nasal spray 2 sprays each nostril daily for nasal congestion  Labs today  Chest x ray didn't show any evidence of pneumonia today   Follow up in 2 weeks with Dr. Vaughan Browner or Katie Broadus Costilla,NP. If symptoms do not improve or worsen, please contact office for sooner follow up or seek emergency care.    Allergic rhinitis Target sinus symptoms with addition of astelin. Encouraged consistent use of nasal rinses and intranasal steroid. Restart daily antihistamine. Continue singulair for trigger prevention.   I spent 35 minutes of dedicated to the care of this patient on the date of this encounter to include pre-visit review of records, face-to-face time with the patient discussing conditions above, post visit ordering of testing, clinical documentation with the electronic health record,  making appropriate referrals as documented, and communicating necessary findings to members of the patients care team.  Clayton Bibles, NP 11/10/2022  Pt aware and understands NP's role.

## 2022-11-10 NOTE — Assessment & Plan Note (Signed)
Exacerbation of asthmatic bronchitis with elevated exhaled nitric oxide. Depo inj 120 mg x 1 and duoneb in office. We will treat him with prednisone taper. CXR without superimposed infection and resolution of pna so hold off on further abx therapy. Encouraged to remain compliant with maintenance therapy. Asthma action plan in place. Close follow up. VS stable and non-toxic.  Patient Instructions  Continue Wixela 1 puff Twice daily. Brush tongue and rinse mouth afterwards Continue Albuterol inhaler 2 puffs or 3 mL neb every 6 hours as needed for shortness of breath or wheezing. Notify if symptoms persist despite rescue inhaler/neb use. Use nebulizer at least twice a day until symptoms improve Restart loratadine (claritin) 1 tab daily for allergies  Continue montelukast (singulair) 1 tab At bedtime  Continue saline lavages/rinses 1-2 times a day before you do use your nasocort spray Continue Nasocort 2 sprays each nostril daily for nasal congestion  Prednisone taper. 4 tabs for 3 days, then 3 tabs for 3 days, 2 tabs for 3 days, then 1 tab for 3 days, then stop. Take in AM with food. Start tomorrow. Astelin nasal spray 2 sprays each nostril daily for nasal congestion  Labs today  Chest x ray didn't show any evidence of pneumonia today   Follow up in 2 weeks with Dr. Vaughan Browner or Katie Daxter Paule,NP. If symptoms do not improve or worsen, please contact office for sooner follow up or seek emergency care.

## 2022-11-10 NOTE — Assessment & Plan Note (Signed)
Target sinus symptoms with addition of astelin. Encouraged consistent use of nasal rinses and intranasal steroid. Restart daily antihistamine. Continue singulair for trigger prevention.

## 2022-11-11 LAB — IGE: IgE (Immunoglobulin E), Serum: 56 kU/L (ref <=114)

## 2022-11-11 NOTE — Progress Notes (Signed)
Eosinophils significantly elevated. This is a type of WBC that the body's immune system produces, usually in response to allergies, and can make asthma worse. We will see how he does on current regimen but may need to add biologic therapy in the future if he remains poorly controlled. We can discuss this at his follow up. Thanks.

## 2022-11-15 ENCOUNTER — Telehealth: Payer: Self-pay | Admitting: Nurse Practitioner

## 2022-11-15 MED ORDER — MONTELUKAST SODIUM 10 MG PO TABS: 10.0000 mg | ORAL_TABLET | Freq: Every day | ORAL | 3 refills | Status: DC

## 2022-11-15 NOTE — Telephone Encounter (Signed)
Spoke to pt and he stated that he needs a refill on his Montelukast (Singulair) 10 MG sent to [facility] at [address]. And Roxan Diesel, NP notes from last office visit faxed to Dr. Vevelyn Royals at the [facility]. I informed pt I will send refill to pharmacy and fax Roxan Diesel, NP notes to Dr Vevelyn Royals. Pt verbalized understanding, nothing further needed.

## 2022-11-15 NOTE — Telephone Encounter (Signed)
Pt is needing montelukast sent to [facility] on [address] and [address]- patient also needs his notes sent to Dr. Vevelyn Royals at the [facility]. Fax number is [phone] or [phone]. Please advise. Phone number to Dr. Amedeo Kinsman ofice is [phone] ext [phone] or [phone]

## 2022-11-23 ENCOUNTER — Ambulatory Visit: Payer: No Typology Code available for payment source | Admitting: Pulmonary Disease

## 2022-11-24 ENCOUNTER — Telehealth: Payer: Self-pay | Admitting: Student

## 2022-11-24 MED ORDER — FLUTICASONE-SALMETEROL 500-50 MCG/ACT IN AEPB: 1.0000 | INHALATION_SPRAY | Freq: Two times a day (BID) | RESPIRATORY_TRACT | 5 refills | Status: AC

## 2022-11-24 MED ORDER — ALBUTEROL SULFATE HFA 108 (90 BASE) MCG/ACT IN AERS: 2.0000 | INHALATION_SPRAY | Freq: Four times a day (QID) | RESPIRATORY_TRACT | 5 refills | Status: AC | PRN

## 2022-11-24 NOTE — Telephone Encounter (Signed)
Rx for both wixela and albuterol were sent to preferred pharmacy for pt. Called and spoke with pt letting him know this had been done and he verbalized understanding. Nothing further needed.

## 2022-11-24 NOTE — Telephone Encounter (Signed)
Pls call PT. Wonders if he can get a refill for Wixela and Albuterol before his next appt or should he wait.   [phone]  States he is doing better  Pharm: [facility] in [place]

## 2022-11-25 ENCOUNTER — Telehealth: Payer: Self-pay | Admitting: Pulmonary Disease

## 2022-11-25 MED ORDER — ALBUTEROL SULFATE (2.5 MG/3ML) 0.083% IN NEBU: 2.5000 mg | INHALATION_SOLUTION | Freq: Four times a day (QID) | RESPIRATORY_TRACT | 12 refills | Status: AC | PRN

## 2022-11-25 NOTE — Telephone Encounter (Signed)
PT asked [place] to call in the wrong things. Needs: ipratropium-albuterol (DUONEB) 0.5-2.5 (3) MG/3ML nebulizer solution 3 mL   [[id number]]   Nebul;izer liq.  Pharm is : [facility] on [address]  See last encounter for more. He has taken it home.

## 2022-11-25 NOTE — Telephone Encounter (Signed)
Spoke with pt who states he needs refill of nebulizer solution refilled. Duonebs are listed on medication list but as an inpatient medication. Refill on albuterol (per last OV AVS)  was sent into verified pharmacy. Pt notified. Nothing further needed at this time.

## 2022-12-09 ENCOUNTER — Ambulatory Visit (INDEPENDENT_AMBULATORY_CARE_PROVIDER_SITE_OTHER): Payer: No Typology Code available for payment source | Admitting: Pulmonary Disease

## 2022-12-09 ENCOUNTER — Encounter: Payer: Self-pay | Admitting: Pulmonary Disease

## 2022-12-09 VITALS — BP 132/70 | HR 82 | Temp 98.2°F | Ht 71.0 in | Wt 207.2 lb

## 2022-12-09 DIAGNOSIS — J301 Allergic rhinitis due to pollen: Secondary | ICD-10-CM | POA: Diagnosis not present

## 2022-12-09 DIAGNOSIS — J4540 Moderate persistent asthma, uncomplicated: Secondary | ICD-10-CM

## 2022-12-09 NOTE — Patient Instructions (Signed)
I am glad you are doing well with regard to breathing Continue your inhalers and Singulair as prescribed Follow-up in 6 months

## 2022-12-09 NOTE — Progress Notes (Signed)
VU LIEBMAN    [id number]    11/09/1946  Primary Care Physician:Morrow, Delfino Lovett, NP  Referring Physician: Maximiano Coss, NP [place],  [place] [id number]  Chief complaint: Follow-up for asthma  HPI: 77 y.o.  with history of hyperlipidemia, allergies, chronic rhinosinusitis with nasal polyps Complains of recurrent attacks of chronic bronchitis, cough since January 2020.  Also suffered broken ribs from a fall, excessive coughing. In January he was treated with prednisone for 10 days and given an albuterol inhaler.  He states that his symptoms improved while on steroids but recur once he stops the treatment.  Seen in ED on 9/2 for cough, dyspnea with normal chest x-ray.  Labs noted for significant peripheral eosinophilia.  He has history of chronic rhinosinusitis and nasal polyps status post sinus surgery in 2005.  Seen by Dr. Valetta Mole, ENT in 2018  for swollen uvula.  Findings consistent with GERD and PPI therapy recommended.  Pets: No pets Occupation: Used to work for occupational therapy.  Currently retired Exposures: No known exposures.  No mold, hot tub, Jacuzzi Smoking history: Never smoker Travel history: Originally from [place].  No significant recent travel Relevant family history: Grandfather had COPD.  He was a smoker.  Interim history:  Seen back in my clinic after a gap of 2 and half years. His asthma had been previously well-controlled but had considerable stress last year as his son got shot during a mass shooting event in [place] in 2023.  He had a flareup of asthma at that time.  He also had an episode of bronchitis at the [place] in December 2023 which call with another flareup Given prednisone taper in pulmonary clinic with improvement in symptoms  Continues on Wixela inhaler, Singulair and albuterol as needed.  States that his breathing is back to normal and feels well now.  Outpatient Encounter Medications as of 12/09/2022  Medication Sig    albuterol (PROVENTIL) (2.5 MG/3ML) 0.083% nebulizer solution Take 3 mLs (2.5 mg total) by nebulization every 6 (six) hours as needed for wheezing or shortness of breath.   albuterol (VENTOLIN HFA) 108 (90 Base) MCG/ACT inhaler Inhale 2 puffs into the lungs every 6 (six) hours as needed for wheezing or shortness of breath.   atorvastatin (LIPITOR) 20 MG tablet TAKE 1 TABLET(20 MG) BY MOUTH DAILY   azelastine (ASTELIN) 0.1 % nasal spray Place 2 sprays into both nostrils 2 (two) times daily. Use in each nostril as directed   fluticasone-salmeterol (WIXELA INHUB) 500-50 MCG/ACT AEPB Inhale 1 puff into the lungs in the morning and at bedtime.   glucosamine-chondroitin 500-400 MG tablet Take 1 tablet by mouth daily.   lidocaine (LIDODERM) 5 % Place 1 patch onto the skin daily. Remove & Discard patch within 12 hours or as directed by MD   loratadine (CLARITIN) 10 MG tablet Take 1 tablet (10 mg total) by mouth daily. 2 times daily   montelukast (SINGULAIR) 10 MG tablet Take 1 tablet (10 mg total) by mouth at bedtime.   omeprazole (PRILOSEC) 40 MG capsule TAKE 1 CAPSULE BY MOUTH EVERY DAY BEFORE DINNER   Spacer/Aero-Hold Chamber Bags MISC 1 Device by Does not apply route as directed.   triamcinolone (NASACORT) 55 MCG/ACT AERO nasal inhaler Place 2 sprays into the nose daily. As needed   [DISCONTINUED] amoxicillin-clavulanate (AUGMENTIN) 875-125 MG tablet Take 1 tablet by mouth 2 (two) times daily.   [DISCONTINUED] aspirin 81 MG tablet Take 81 mg by mouth 2 (  two) times a week. Mon, Wed, Fri    [DISCONTINUED] predniSONE (DELTASONE) 10 MG tablet 4 tabs for 3 days, then 3 tabs for 3 days, 2 tabs for 3 days, then 1 tab for 3 days, then stop   Facility-Administered Encounter Medications as of 12/09/2022  Medication   ipratropium-albuterol (DUONEB) 0.5-2.5 (3) MG/3ML nebulizer solution 3 mL    Allergies as of 12/09/2022 - Review Complete 11/10/2022  Allergen Reaction Noted   Latex Swelling 08/19/2013    Physical Exam: Blood pressure 132/70, pulse 82, temperature 98.2 F (36.8 C), temperature source Oral, height '5\' 11"'$  (1.803 m), weight 207 lb 3.2 oz (94 kg), SpO2 97 %. Gen:      No acute distress HEENT:  EOMI, sclera anicteric Neck:     No masses; no thyromegaly Lungs:    Clear to auscultation bilaterally; normal respiratory effort CV:         Regular rate and rhythm; no murmurs Abd:      + bowel sounds; soft, non-tender; no palpable masses, no distension Ext:    No edema; adequate peripheral perfusion Skin:      Warm and dry; no rash Neuro: alert and oriented x 3 Psych: normal mood and affect   Data Reviewed: Imaging: CT chest 12/25/2018- no evidence of interstitial lung disease.  Minimal bibasilar atelectasis Chest x-ray 07/15/19- minimal bibasilar opacities.   Chest x-ray 11/10/2022-no active cardiopulmonary disease I have reviewed the images personally.  PFTs: 09/12/19-FVC 4.21 [92%], FEV1 3.58 [106%), F/F 85, TLC 7.34 [101%], DLCO 28.13 [105%) Minimal obstruction  Act score 03/22/2020-24 Act score 07/16/2020- 23  Labs: Respiratory allergy profile 07/23/2019-IgE 45, RAST panel negative SARS-CoV-2 04/26/2019- negative  CBC 11/02/2022-WBC 10.7, eos 4.6%, absolute eosinophil count 492  Assessment:  Moderate persistent asthma Likely has asthma, reactive airway disease given history of sinus issues, GERD, elevated peripheral eosinophils PFTs reviewed with no overt obstruction but curvature to flow loop in FEV1 and mid flow rates suggestive of reactive airways  Back to baseline after he had a couple of flareups last year. Continue Wixela, Singulair and albuterol as needed  Chronic sinusitis Continue steroid nasal spray, Singulair and Claritin  Acid reflux Continue Prilosec  Plan/Recommendations: - Wixela - Steroid nasal spray, Singulair, Claritin  Marshell Garfinkel MD [facility] 12/09/2022, 10:21 AM  CC: Maximiano Coss, NP

## 2023-05-31 ENCOUNTER — Encounter: Payer: Self-pay | Admitting: Pulmonary Disease

## 2023-05-31 ENCOUNTER — Ambulatory Visit: Payer: No Typology Code available for payment source | Admitting: Pulmonary Disease

## 2023-05-31 VITALS — BP 122/66 | HR 62 | Temp 98.1°F | Ht 71.0 in | Wt 203.2 lb

## 2023-05-31 DIAGNOSIS — J4540 Moderate persistent asthma, uncomplicated: Secondary | ICD-10-CM | POA: Diagnosis not present

## 2023-05-31 MED ORDER — MONTELUKAST SODIUM 10 MG PO TABS: 10.0000 mg | ORAL_TABLET | Freq: Every day | ORAL | 3 refills | Status: AC

## 2023-05-31 NOTE — Progress Notes (Signed)
Carl Henry    [id number]    [birth year]  Primary Care Physician:Morrow, Gerlene Burdock, NP  Referring Physician: Janeece Agee, NP [address] [place],  [place] [address]  Chief complaint: Follow-up for asthma  HPI: 77 y.o.  with history of hyperlipidemia, allergies, chronic rhinosinusitis with nasal polyps Complains of recurrent attacks of chronic bronchitis, cough since January 2020.  Also suffered broken ribs from a fall, excessive coughing. In January he was treated with prednisone for 10 days and given an albuterol inhaler.  He states that his symptoms improved while on steroids but recur once he stops the treatment.  Seen in ED on 9/2 for cough, dyspnea with normal chest x-ray.  Labs noted for significant peripheral eosinophilia.  He has history of chronic rhinosinusitis and nasal polyps status post sinus surgery in 2005.  Seen by Dr. Jeananne Rama, ENT in 2018  for swollen uvula.  Findings consistent with GERD and PPI therapy recommended.  His asthma had been previously well-controlled but had considerable stress in 2023 as his son got shot during a mass shooting event in [place] in 2023.  He had a flareup of asthma at that time.  He also had an episode of bronchitis at the [place] in December 2023 which call with another flareup Given prednisone taper in pulmonary clinic with improvement in symptoms  Pets: No pets Occupation: Used to work for occupational therapy.  Currently retired Exposures: No known exposures.  No mold, hot tub, Jacuzzi Smoking history: Never smoker Travel history: Originally from [place].  No significant recent travel Relevant family history: Grandfather had COPD.  He was a smoker.  Interim history:  Had an acute visit in December for asthma flareup which was treated with prednisone.  He improved but for the past 1 month has some sinus congestion and cold  Continues on Wixela inhaler, albuterol as needed.  For some reason he stopped the Singulair  medication as he ran out and did not have any refills  Outpatient Encounter Medications as of 05/31/2023  Medication Sig   albuterol (PROVENTIL) (2.5 MG/3ML) 0.083% nebulizer solution Take 3 mLs (2.5 mg total) by nebulization every 6 (six) hours as needed for wheezing or shortness of breath.   albuterol (VENTOLIN HFA) 108 (90 Base) MCG/ACT inhaler Inhale 2 puffs into the lungs every 6 (six) hours as needed for wheezing or shortness of breath.   atorvastatin (LIPITOR) 20 MG tablet TAKE 1 TABLET(20 MG) BY MOUTH DAILY   azelastine (ASTELIN) 0.1 % nasal spray Place 2 sprays into both nostrils 2 (two) times daily. Use in each nostril as directed   fluticasone-salmeterol (WIXELA INHUB) 500-50 MCG/ACT AEPB Inhale 1 puff into the lungs in the morning and at bedtime.   glucosamine-chondroitin 500-400 MG tablet Take 1 tablet by mouth daily.   lidocaine (LIDODERM) 5 % Place 1 patch onto the skin daily. Remove & Discard patch within 12 hours or as directed by MD   loratadine (CLARITIN) 10 MG tablet Take 1 tablet (10 mg total) by mouth daily. 2 times daily   omeprazole (PRILOSEC) 40 MG capsule TAKE 1 CAPSULE BY MOUTH EVERY DAY BEFORE DINNER   Spacer/Aero-Hold Chamber Bags MISC 1 Device by Does not apply route as directed.   triamcinolone (NASACORT) 55 MCG/ACT AERO nasal inhaler Place 2 sprays into the nose daily. As needed   montelukast (SINGULAIR) 10 MG tablet Take 1 tablet (10 mg total) by mouth at bedtime. (Patient not taking: Reported on 05/31/2023)   Facility-Administered  Encounter Medications as of 05/31/2023  Medication   ipratropium-albuterol (DUONEB) 0.5-2.5 (3) MG/3ML nebulizer solution 3 mL    Allergies as of 05/31/2023 - Review Complete 11/10/2022  Allergen Reaction Noted   Latex Swelling 08/19/2013   Physical Exam: Blood pressure 122/66, pulse 62, temperature 98.1 F (36.7 C), temperature source Oral, height 5\' 11"  (1.803 m), weight 203 lb 3.2 oz (92.2 kg), SpO2 98%. Gen:      No acute  distress HEENT:  EOMI, sclera anicteric Neck:     No masses; no thyromegaly Lungs:    Clear to auscultation bilaterally; normal respiratory effort CV:         Regular rate and rhythm; no murmurs Abd:      + bowel sounds; soft, non-tender; no palpable masses, no distension Ext:    No edema; adequate peripheral perfusion Skin:      Warm and dry; no rash Neuro: alert and oriented x 3 Psych: normal mood and affect   Data Reviewed: Imaging: CT chest 12/25/2018- no evidence of interstitial lung disease.  Minimal bibasilar atelectasis Chest x-ray 07/15/19- minimal bibasilar opacities.   Chest x-ray 11/10/2022-no active cardiopulmonary disease I have reviewed the images personally.  PFTs: 09/12/19-FVC 4.21 [92%], FEV1 3.58 [106%), F/F 85, TLC 7.34 [101%], DLCO 28.13 [105%) Minimal obstruction  Act score 03/22/2020-24 Act score 07/16/2020- 23  Labs: Respiratory allergy profile 07/23/2019-IgE 45, RAST panel negative SARS-CoV-2 04/26/2019- negative  CBC 11/02/2022-WBC 10.7, eos 4.6%, absolute eosinophil count 492  Assessment:  Moderate persistent asthma Likely has asthma, reactive airway disease given history of sinus issues, GERD, elevated peripheral eosinophils PFTs reviewed with no overt obstruction but curvature to flow loop in FEV1 and mid flow rates suggestive of reactive airways  Back to baseline after he had a flareup in December Continue Wixela, albuterol as needed Restart Singulair  Chronic sinusitis Continue steroid nasal spray, Singulair and Claritin  Acid reflux Continue Prilosec  Plan/Recommendations: - Wixela - Steroid nasal spray, Claritin - Restart Singulair  Chilton Greathouse MD [facility] 05/31/2023, 8:38 AM  CC: Janeece Agee, NP

## 2023-05-31 NOTE — Patient Instructions (Signed)
Continue the inhaler Will restart the Singulair daily Follow-up in 6 months

## 2023-05-31 NOTE — Addendum Note (Signed)
Addended by: Jacquiline Doe on: 05/31/2023 09:03 AM   Modules accepted: Orders

## 2023-12-29 ENCOUNTER — Ambulatory Visit (INDEPENDENT_AMBULATORY_CARE_PROVIDER_SITE_OTHER): Payer: No Typology Code available for payment source | Admitting: Pulmonary Disease

## 2023-12-29 ENCOUNTER — Encounter: Payer: Self-pay | Admitting: Pulmonary Disease

## 2023-12-29 VITALS — BP 112/70 | HR 61 | Ht 71.0 in | Wt 207.0 lb

## 2023-12-29 DIAGNOSIS — J4540 Moderate persistent asthma, uncomplicated: Secondary | ICD-10-CM | POA: Diagnosis not present

## 2023-12-29 DIAGNOSIS — J329 Chronic sinusitis, unspecified: Secondary | ICD-10-CM

## 2023-12-29 NOTE — Progress Notes (Signed)
 Carl Henry    [id number]    [birth year]  Primary Care Physician:Morrow, Gerlene Burdock, NP  Referring Physician: Janeece Agee, NP [id number] [place],  [place] [address]  Chief complaint: Follow-up for asthma  HPI: 78 y.o.  with history of hyperlipidemia, allergies, chronic rhinosinusitis with nasal polyps Complains of recurrent attacks of chronic bronchitis, cough since January 2020.  Also suffered broken ribs from a fall, excessive coughing. In January he was treated with prednisone for 10 days and given an albuterol inhaler.  He states that his symptoms improved while on steroids but recur once he stops the treatment.  Seen in ED on 9/2 for cough, dyspnea with normal chest x-ray.  Labs noted for significant peripheral eosinophilia.  He has history of chronic rhinosinusitis and nasal polyps status post sinus surgery in 2005.  Seen by Dr. Jeananne Henry, ENT in 2018  for swollen uvula.  Findings consistent with GERD and PPI therapy recommended.  His asthma had been previously well-controlled but had considerable stress in 2023 as his son got shot during a mass shooting event in [place] in 2023.  He had a flareup of asthma at that time.  He also had an episode of bronchitis at the [place] in December 2023 which call with another flareup Given prednisone taper in pulmonary clinic with improvement in symptoms  Pets: No pets Occupation: Used to work for occupational therapy.  Currently retired Exposures: No known exposures.  No mold, hot tub, Jacuzzi Smoking history: Never smoker Travel history: Originally from [place].  No significant recent travel Relevant family history: Grandfather had COPD.  He was a smoker.  Interim history:  Discussed the use of AI scribe software for clinical note transcription with the patient, who gave verbal consent to proceed.  Carl Hoes "[name]" is a 78 year old male with mild to moderate asthma who presents for a follow-up visit.  He has mild  to moderate asthma and is currently stable on his medication regimen, which includes Wixela and Singulair. He uses albuterol as needed and has not required it since November 2024, indicating good control of his asthma symptoms.  He has a history of sinus issues, which have improved significantly. He uses a steroid nasal spray as needed and reports that his sinus symptoms have cleared up, allowing him to smell and taste more effectively. He is also on Claritin and Singulair for his sinus issues. Additionally, he uses prednisolone eye drops for nasal polyps, which he reports have been effective.  He is on Prilosec for acid reflux, which is part of his current medication regimen.   Outpatient Encounter Medications as of 12/29/2023  Medication Sig   albuterol (PROVENTIL) (2.5 MG/3ML) 0.083% nebulizer solution Take 3 mLs (2.5 mg total) by nebulization every 6 (six) hours as needed for wheezing or shortness of breath.   albuterol (VENTOLIN HFA) 108 (90 Base) MCG/ACT inhaler Inhale 2 puffs into the lungs every 6 (six) hours as needed for wheezing or shortness of breath.   azelastine (ASTELIN) 0.1 % nasal spray Place 2 sprays into both nostrils 2 (two) times daily. Use in each nostril as directed   celecoxib (CELEBREX) 100 MG capsule Take 100 mg by mouth daily.   fluticasone-salmeterol (WIXELA INHUB) 500-50 MCG/ACT AEPB Inhale 1 puff into the lungs in the morning and at bedtime.   glucosamine-chondroitin 500-400 MG tablet Take 1 tablet by mouth daily.   lidocaine (LIDODERM) 5 % Place 1 patch onto the skin daily.  Remove & Discard patch within 12 hours or as directed by MD   loratadine (CLARITIN) 10 MG tablet Take 1 tablet (10 mg total) by mouth daily. 2 times daily   montelukast (SINGULAIR) 10 MG tablet Take 1 tablet (10 mg total) by mouth at bedtime.   omeprazole (PRILOSEC) 40 MG capsule TAKE 1 CAPSULE BY MOUTH EVERY DAY BEFORE DINNER   prednisoLONE acetate (PRED FORTE) 1 % ophthalmic suspension As directed    rosuvastatin (CRESTOR) 5 MG tablet Take 5 mg by mouth daily.   Spacer/Aero-Hold Chamber Bags MISC 1 Device by Does not apply route as directed.   triamcinolone (NASACORT) 55 MCG/ACT AERO nasal inhaler Place 2 sprays into the nose daily. As needed   [DISCONTINUED] atorvastatin (LIPITOR) 20 MG tablet TAKE 1 TABLET(20 MG) BY MOUTH DAILY   Facility-Administered Encounter Medications as of 12/29/2023  Medication   ipratropium-albuterol (DUONEB) 0.5-2.5 (3) MG/3ML nebulizer solution 3 mL    Allergies as of 12/29/2023 - Review Complete 12/29/2023  Allergen Reaction Noted   Latex Swelling 08/19/2013   Physical Exam: Blood pressure 112/70, pulse 61, height 5\' 11"  (1.803 m), weight 207 lb (93.9 kg), SpO2 97%. Gen:      No acute distress HEENT:  EOMI, sclera anicteric Neck:     No masses; no thyromegaly Lungs:    Clear to auscultation bilaterally; normal respiratory effort CV:         Regular rate and rhythm; no murmurs Abd:      + bowel sounds; soft, non-tender; no palpable masses, no distension Ext:    No edema; adequate peripheral perfusion Skin:      Warm and dry; no rash Neuro: alert and oriented x 3 Psych: normal mood and affect   Data Reviewed: Imaging: CT chest 12/25/2018- no evidence of interstitial lung disease.  Minimal bibasilar atelectasis Chest x-ray 07/15/19- minimal bibasilar opacities.   Chest x-ray 11/10/2022-no active cardiopulmonary disease I have reviewed the images personally.  PFTs: 09/12/19-FVC 4.21 [92%], FEV1 3.58 [106%), F/F 85, TLC 7.34 [101%], DLCO 28.13 [105%) Minimal obstruction  Act score 03/22/2020-24 Act score 07/16/2020- 23  Labs: Respiratory allergy profile 07/23/2019-IgE 45, RAST panel negative SARS-CoV-2 04/26/2019- negative  CBC 11/02/2022-WBC 10.7, eos 4.6%, absolute eosinophil count 492  Assessment:  Asthma PFTs reviewed with no overt obstruction but curvature to flow loop in FEV1 and mid flow rates suggestive of reactive airways. Likely has  asthma, reactive airway disease given history of sinus issues, GERD, elevated peripheral eosinophils Well-controlled on current regimen. Uses albuterol PRN, last use in November. No recent exacerbations or symptoms. Prefers primary care for ongoing management, will refer to pulmonology if changes occur.  - Continue current asthma regimen (Wixela, Singulair) - Use albuterol PRN - Follow up with primary care for ongoing management.  Patient is comfortable with the plan - Refer to pulmonology if changes or exacerbations occur  Chronic Sinusitis with Nasal Polyps Chronic sinusitis with nasal polyps, well-managed with steroid nasal spray and intranasal prednisolone eye drops. Reports significant symptom improvement, including better sense of smell and taste. Follow-up with ENT at [facility] next month. - Continue nasal steroid spray as needed - Continue intranasal prednisolone eye drops as prescribed - Follow up with ENT at [facility] next month  Gastroesophageal Reflux Disease (GERD) GERD, managed with Prilosec. No current symptoms or issues discussed. - Continue Prilosec as prescribed.   Plan/Recommendations: - Wixela, Singulair - Steroid nasal spray, Claritin - Follow up as needed  Chilton Greathouse MD [facility] 12/29/2023, 10:02  AM  CC: Carl Agee, NP

## 2023-12-29 NOTE — Patient Instructions (Signed)
I am glad to well with your breathing Continue the inhalers and asthma medications as prescribed Follow-up with [place] as needed

## 2024-12-17 ENCOUNTER — Encounter: Payer: Self-pay | Admitting: Pulmonary Disease

## 2024-12-17 ENCOUNTER — Ambulatory Visit: Admitting: Pulmonary Disease

## 2024-12-17 NOTE — Patient Instructions (Signed)
" °  VISIT SUMMARY: Today you had a routine follow-up appointment to check on your asthma and chronic sinusitis.  YOUR PLAN: ASTHMA: Your asthma is well-controlled with your current medications. You have not had any recent breathing problems. -Continue taking Wixella and Singulair  as your regular medications. -Use albuterol  as needed for any breathing issues. -Follow up with your primary care provider, Dr. Kip, for ongoing asthma management.  CHRONIC SINUSITIS: You are managing your chronic sinusitis symptoms with Flonase  and are now seeing a new ENT specialist. -Continue using Flonase  as needed for your sinusitis symptoms.    Contains text generated by [employer].   "

## 2024-12-17 NOTE — Progress Notes (Unsigned)
 "              Carl Henry    [id number]    [birth year]  Primary Care Physician:Morrow, Charlie, NP  Referring Physician: Kip Charlie, NP [address] [place],  [place] [address]  Chief complaint: Follow-up for asthma  HPI: 79 y.o.  with history of hyperlipidemia, allergies, chronic rhinosinusitis with nasal polyps Complains of recurrent attacks of chronic bronchitis, cough since January 2020.  Also suffered broken ribs from a fall, excessive coughing. In January he was treated with prednisone  for 10 days and given an albuterol  inhaler.  He states that his symptoms improved while on steroids but recur once he stops the treatment.  Seen in ED on 9/2 for cough, dyspnea with normal chest x-ray.  Labs noted for significant peripheral eosinophilia.  He has history of chronic rhinosinusitis and nasal polyps status post sinus surgery in 2005.  Seen by Dr. Gerldine, ENT in 2018  for swollen uvula.  Findings consistent with GERD and PPI therapy recommended.  His asthma had been previously well-controlled but had considerable stress in 2023 as his son got shot during a mass shooting event in [place]  in 2023.  He had a flareup of asthma at that time.  He also had an episode of bronchitis at the [facility] in December 2023 which call with another flareup Given prednisone  taper in pulmonary clinic with improvement in symptoms  Pets: No pets Occupation: Used to work for occupational therapy.  Currently retired Exposures: No known exposures.  No mold, hot tub, Jacuzzi Smoking history: Never smoker Travel history: Originally from [place] .  No significant recent travel Relevant family history: Grandfather had COPD.  He was a smoker.  Interim history:  Discussed the use of AI scribe software for clinical note transcription with the patient, who gave verbal consent to proceed.  Carl Henry Carl Henry is a 79 year old male with mild to moderate asthma who presents for a follow-up visit.  He has mild  to moderate asthma and is currently stable on his medication regimen, which includes Wixela and Singulair . He uses albuterol  as needed and has not required it since November 2024, indicating good control of his asthma symptoms.  He has a history of sinus issues, which have improved significantly. He uses a steroid nasal spray as needed and reports that his sinus symptoms have cleared up, allowing him to smell and taste more effectively. He is also on Claritin  and Singulair  for his sinus issues. Additionally, he uses prednisolone eye drops for nasal polyps, which he reports have been effective.  He is on Prilosec for acid reflux, which is part of his current medication regimen.   Outpatient Encounter Medications as of 12/17/2024  Medication Sig   albuterol  (PROVENTIL ) (2.5 MG/3ML) 0.083% nebulizer solution Take 3 mLs (2.5 mg total) by nebulization every 6 (six) hours as needed for wheezing or shortness of breath.   albuterol  (VENTOLIN  HFA) 108 (90 Base) MCG/ACT inhaler Inhale 2 puffs into the lungs every 6 (six) hours as needed for wheezing or shortness of breath.   azelastine  (ASTELIN ) 0.1 % nasal spray Place 2 sprays into both nostrils 2 (two) times daily. Use in each nostril as directed   celecoxib (CELEBREX) 100 MG capsule Take 100 mg by mouth daily.   fluticasone -salmeterol (WIXELA INHUB) 500-50 MCG/ACT AEPB Inhale 1 puff into the lungs in the morning and at bedtime.   glucosamine-chondroitin 500-400 MG tablet Take 1 tablet by mouth daily.   lidocaine  (LIDODERM ) 5 %  Place 1 patch onto the skin daily. Remove & Discard patch within 12 hours or as directed by MD (Patient taking differently: Place 1 patch onto the skin daily. Remove & Discard patch within 12 hours or as directed by MD. PRN)   loratadine  (CLARITIN ) 10 MG tablet Take 1 tablet (10 mg total) by mouth daily. 2 times daily   montelukast  (SINGULAIR ) 10 MG tablet Take 1 tablet (10 mg total) by mouth at bedtime.   omeprazole  (PRILOSEC) 40 MG  capsule TAKE 1 CAPSULE BY MOUTH EVERY DAY BEFORE DINNER   prednisoLONE acetate (PRED FORTE) 1 % ophthalmic suspension As directed   rosuvastatin  (CRESTOR ) 5 MG tablet Take 5 mg by mouth daily.   triamcinolone (NASACORT) 55 MCG/ACT AERO nasal inhaler Place 2 sprays into the nose daily. As needed   Spacer/Aero-Hold Chamber Bags MISC 1 Device by Does not apply route as directed. (Patient not taking: Reported on 12/17/2024)   Facility-Administered Encounter Medications as of 12/17/2024  Medication   ipratropium-albuterol  (DUONEB) 0.5-2.5 (3) MG/3ML nebulizer solution 3 mL    Allergies as of 12/17/2024 - Review Complete 12/17/2024  Allergen Reaction Noted   Latex Swelling 08/19/2013   Physical Exam: Blood pressure 112/70, pulse 61, height 5' 11 (1.803 m), weight 207 lb (93.9 kg), SpO2 97%. Gen:      No acute distress HEENT:  EOMI, sclera anicteric Neck:     No masses; no thyromegaly Lungs:    Clear to auscultation bilaterally; normal respiratory effort CV:         Regular rate and rhythm; no murmurs Abd:      + bowel sounds; soft, non-tender; no palpable masses, no distension Ext:    No edema; adequate peripheral perfusion Skin:      Warm and dry; no rash Neuro: alert and oriented x 3 Psych: normal mood and affect   Data Reviewed: Imaging: CT chest 12/25/2018- no evidence of interstitial lung disease.  Minimal bibasilar atelectasis Chest x-ray 07/15/19- minimal bibasilar opacities.   Chest x-ray 11/10/2022-no active cardiopulmonary disease I have reviewed the images personally.  PFTs: 09/12/19-FVC 4.21 [92%], FEV1 3.58 [106%), F/F 85, TLC 7.34 [101%], DLCO 28.13 [105%) Minimal obstruction  Act score 03/22/2020-24 Act score 07/16/2020- 23  Labs: Respiratory allergy  profile 07/23/2019-IgE 45, RAST panel negative SARS-CoV-2 04/26/2019- negative  CBC 11/02/2022-WBC 10.7, eos 4.6%, absolute eosinophil count 492  Assessment:  Asthma PFTs reviewed with no overt obstruction but curvature to  flow loop in FEV1 and mid flow rates suggestive of reactive airways. Likely has asthma, reactive airway disease given history of sinus issues, GERD, elevated peripheral eosinophils Well-controlled on current regimen. Uses albuterol  PRN, last use in November. No recent exacerbations or symptoms. Prefers primary care for ongoing management, will refer to pulmonology if changes occur.  - Continue current asthma regimen (Wixela, Singulair ) - Use albuterol  PRN - Follow up with primary care for ongoing management.  Patient is comfortable with the plan - Refer to pulmonology if changes or exacerbations occur  Chronic Sinusitis with Nasal Polyps Chronic sinusitis with nasal polyps, well-managed with steroid nasal spray and intranasal prednisolone eye drops. Reports significant symptom improvement, including better sense of smell and taste. Follow-up with ENT at [facility] next month. - Continue nasal steroid spray as needed - Continue intranasal prednisolone eye drops as prescribed - Follow up with ENT at [facility] next month  Gastroesophageal Reflux Disease (GERD) GERD, managed with Prilosec. No current symptoms or issues discussed. - Continue Prilosec as prescribed.   Plan/Recommendations: - Wixela, Singulair  -  Steroid nasal spray, Claritin  - Follow up as needed  Lonna Coder MD  Pulmonary and Critical Care 12/17/2024, 3:39 PM  CC: Kip Ade, NP   "
